# Patient Record
Sex: Male | Born: 1985 | Race: White | Hispanic: No | Marital: Single | Smoking: Current some day smoker
Health system: Southern US, Community
[De-identification: ages and names within clinical notes are randomized; demographics above are authoritative.]

## PROBLEM LIST (undated history)

## (undated) DIAGNOSIS — F319 Bipolar disorder, unspecified: Secondary | ICD-10-CM

## (undated) DIAGNOSIS — Z789 Other specified health status: Secondary | ICD-10-CM

## (undated) DIAGNOSIS — F209 Schizophrenia, unspecified: Secondary | ICD-10-CM

## (undated) HISTORY — PX: NO PAST SURGERIES: SHX2092

---

## 2021-11-17 ENCOUNTER — Other Ambulatory Visit: Payer: Self-pay

## 2021-11-17 ENCOUNTER — Encounter (HOSPITAL_COMMUNITY): Payer: Self-pay | Admitting: Emergency Medicine

## 2021-11-17 ENCOUNTER — Emergency Department (HOSPITAL_COMMUNITY)
Admission: EM | Admit: 2021-11-17 | Discharge: 2021-11-18 | Disposition: A | Discharge status: Home / Self Care | Payer: Medicare HMO | Attending: Emergency Medicine | Admitting: Emergency Medicine

## 2021-11-17 DIAGNOSIS — Z20822 Contact with and (suspected) exposure to covid-19: Secondary | ICD-10-CM | POA: Diagnosis not present

## 2021-11-17 DIAGNOSIS — F319 Bipolar disorder, unspecified: Secondary | ICD-10-CM

## 2021-11-17 DIAGNOSIS — F32A Depression, unspecified: Secondary | ICD-10-CM | POA: Diagnosis present

## 2021-11-17 DIAGNOSIS — F209 Schizophrenia, unspecified: Secondary | ICD-10-CM | POA: Insufficient documentation

## 2021-11-17 DIAGNOSIS — R45851 Suicidal ideations: Secondary | ICD-10-CM | POA: Diagnosis not present

## 2021-11-17 HISTORY — DX: Bipolar disorder, unspecified: F31.9

## 2021-11-17 HISTORY — DX: Schizophrenia, unspecified: F20.9

## 2021-11-17 LAB — COMPREHENSIVE METABOLIC PANEL
ALT: 19 U/L (ref 0–44)
AST: 26 U/L (ref 15–41)
Albumin: 3.8 g/dL (ref 3.5–5.0)
Alkaline Phosphatase: 73 U/L (ref 38–126)
Anion gap: 10 (ref 5–15)
BUN: 14 mg/dL (ref 6–20)
CO2: 24 mmol/L (ref 22–32)
Calcium: 9.3 mg/dL (ref 8.9–10.3)
Chloride: 106 mmol/L (ref 98–111)
Creatinine, Ser: 0.97 mg/dL (ref 0.61–1.24)
GFR, Estimated: >60 mL/min (ref >60)
Glucose, Bld: 134 mg/dL — ABNORMAL HIGH (ref 70–99)
Potassium: 3.4 mmol/L — ABNORMAL LOW (ref 3.5–5.1)
Sodium: 140 mmol/L (ref 135–145)
Total Bilirubin: 0.7 mg/dL (ref 0.3–1.2)
Total Protein: 6.4 g/dL — ABNORMAL LOW (ref 6.5–8.1)

## 2021-11-17 LAB — CBC
HCT: 39.3 % (ref 39.0–52.0)
Hemoglobin: 13.2 g/dL (ref 13.0–17.0)
MCH: 31.1 pg (ref 26.0–34.0)
MCHC: 33.6 g/dL (ref 30.0–36.0)
MCV: 92.5 fL (ref 80.0–100.0)
Platelets: 164 10*3/uL (ref 150–400)
RBC: 4.25 MIL/uL (ref 4.22–5.81)
RDW: 12.4 % (ref 11.5–15.5)
WBC: 5.1 10*3/uL (ref 4.0–10.5)
nRBC: 0 % (ref 0.0–0.2)

## 2021-11-17 LAB — RAPID URINE DRUG SCREEN, HOSP PERFORMED
Amphetamines: NOT DETECTED
Barbiturates: NOT DETECTED
Benzodiazepines: NOT DETECTED
Cocaine: NOT DETECTED
Opiates: NOT DETECTED
Tetrahydrocannabinol: NOT DETECTED

## 2021-11-17 LAB — ETHANOL: Alcohol, Ethyl (B): <10 mg/dL (ref <10)

## 2021-11-17 LAB — RESP PANEL BY RT-PCR (FLU A&B, COVID) ARPGX2
Influenza A by PCR: NEGATIVE
Influenza B by PCR: NEGATIVE
SARS Coronavirus 2 by RT PCR: NEGATIVE

## 2021-11-17 LAB — SALICYLATE LEVEL: Salicylate Lvl: <7 mg/dL — ABNORMAL LOW (ref 7.0–30.0)

## 2021-11-17 LAB — ACETAMINOPHEN LEVEL: Acetaminophen (Tylenol), Serum: <10 ug/mL — ABNORMAL LOW (ref 10–30)

## 2021-11-17 MED ORDER — ACETAMINOPHEN 325 MG PO TABS: 650.0000 mg | ORAL_TABLET | Freq: Four times a day (QID) | ORAL | Status: DC | PRN

## 2021-11-17 MED ORDER — LORAZEPAM 1 MG PO TABS
1.0000 mg | ORAL_TABLET | Freq: Once | ORAL | Status: AC
  Administered 2021-11-18: 1 mg via ORAL
  Filled 2021-11-17: qty 1

## 2021-11-17 MED ORDER — IBUPROFEN 400 MG PO TABS
400.0000 mg | ORAL_TABLET | Freq: Four times a day (QID) | ORAL | Status: DC | PRN
  Administered 2021-11-18: 400 mg via ORAL
  Filled 2021-11-17: qty 1

## 2021-11-17 NOTE — ED Notes (Signed)
Pt belongings inventoried and placed in locker 1 

## 2021-11-17 NOTE — ED Provider Notes (Signed)
Patient initially presented with suicidal ideation, did not elaborate much with triage nurse/tech. While waiting in the ED he is requesting to leave, encouraged to remain int he ED, he escalated some despite attempts at redirection, ultimately IVC paperwork completed.    Maurice [name] 11/17/21 3709    Glynn Octave, MD 11/17/21 [phone]

## 2021-11-17 NOTE — ED Provider Notes (Signed)
[facility] EMERGENCY DEPARTMENT Provider Note   CSN: [id number] Arrival date & time: 11/17/21  0416     History Chief Complaint  Patient presents with   Suicidal    IVC by PA    Maurice [name] is a 35 y.o. male.  HPI  Patient presents to the ED with complaints of depression and suicidal ideation.  Patient states that has been ongoing for a while.  Maybe a year.  He has been thinking about taking pills.  He denies any other complaints at this time.  No chest pain.  No abdominal pain.  No fevers or chills.  Patient initially was IVC by the initial screening provider as the patient was attempting to leave.  History reviewed. No pertinent past medical history.  There are no problems to display for this patient.   History reviewed. No pertinent surgical history.     No family history on file.     Home Medications Prior to Admission medications   Medication Sig Start Date End Date Taking? Authorizing Provider  ibuprofen (ADVIL) 200 MG tablet Take 400 mg by mouth every 6 (six) hours as needed for headache or moderate pain.   Yes [provider]    Allergies    Patient has no known allergies.  Review of Systems   Review of Systems  All other systems reviewed and are negative.  Physical Exam Updated Vital Signs BP 100/68 (BP Location: Right Arm)   Pulse 64   Temp 97.9 F (36.6 C) (Oral)   Resp 16   SpO2 97%   Physical Exam Vitals and nursing note reviewed.  Constitutional:      General: He is not in acute distress.    Appearance: He is well-developed.  HENT:     Head: Normocephalic and atraumatic.     Right Ear: External ear normal.     Left Ear: External ear normal.  Eyes:     General: No scleral icterus.       Right eye: No discharge.        Left eye: No discharge.     Conjunctiva/sclera: Conjunctivae normal.  Neck:     Trachea: No tracheal deviation.  Cardiovascular:     Rate and Rhythm: Normal rate and regular rhythm.   Pulmonary:     Effort: Pulmonary effort is normal. No respiratory distress.     Breath sounds: Normal breath sounds. No stridor. No wheezing or rales.  Abdominal:     General: Bowel sounds are normal. There is no distension.     Palpations: Abdomen is soft.     Tenderness: There is no abdominal tenderness. There is no guarding or rebound.  Musculoskeletal:        General: No tenderness or deformity.     Cervical back: Neck supple.  Skin:    General: Skin is warm and dry.     Findings: No rash.  Neurological:     General: No focal deficit present.     Mental Status: He is alert.     Cranial Nerves: No cranial nerve deficit (no facial droop, extraocular movements intact, no slurred speech).     Sensory: No sensory deficit.     Motor: No abnormal muscle tone or seizure activity.     Coordination: Coordination normal.  Psychiatric:        Mood and Affect: Mood is depressed.        Speech: Speech is not delayed or tangential.  Behavior: Behavior is not aggressive or hyperactive.        Thought Content: Thought content includes suicidal ideation.    ED Results / Procedures / Treatments   Labs (all labs ordered are listed, but only abnormal results are displayed) Labs Reviewed  COMPREHENSIVE METABOLIC PANEL - Abnormal; Notable for the following components:      Result Value   Potassium 3.4 (*)    Glucose, Bld 134 (*)    Total Protein 6.4 (*)    All other components within normal limits  SALICYLATE LEVEL - Abnormal; Notable for the following components:   Salicylate Lvl <7.0 (*)    All other components within normal limits  ACETAMINOPHEN LEVEL - Abnormal; Notable for the following components:   Acetaminophen (Tylenol), Serum <10 (*)    All other components within normal limits  RESP PANEL BY RT-PCR (FLU A&B, COVID) ARPGX2  ETHANOL  CBC  RAPID URINE DRUG SCREEN, HOSP PERFORMED    EKG None  Radiology No results found.  Procedures Procedures   Medications  Ordered in ED Medications - No data to display  ED Course  I have reviewed the triage vital signs and the nursing notes.  Pertinent labs & imaging results that were available during my care of the patient were reviewed by me and considered in my medical decision making (see chart for details).  Clinical Course as of 11/17/21 1715  Mon Nov 17, 2021  1714 Labs reviewed.  CBC and metabolic panel unremarkable.  UDS negative. [JK]    Clinical Course User Index [JK] Linwood Dibbles, MD   MDM Rules/Calculators/A&P                          The patient has been placed in psychiatric observation due to the need to provide a safe environment for the patient while obtaining psychiatric consultation and evaluation, as well as ongoing medical and medication management to treat the patient's condition.  The patient has been placed under full IVC at this time.  Pt medically cleared for psych evaluation  Final Clinical Impression(s) / ED Diagnoses Final diagnoses:  Suicidal ideation    Rx / DC Orders ED Discharge Orders     None        Linwood Dibbles, MD 11/17/21 1715
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
## 2021-11-17 NOTE — BH Assessment (Signed)
Clinician made contact with pt's team at 2157 in preparation to complete pt's MH Assessment. Clinician received no response that pt was able to be moved (as pt was in the hallway at the time), so clinician informed pt's team at 2239 that she needed to move on to the next waiting pt. Clinician requested pt's team inform her when pt is ready to be seen.

## 2021-11-17 NOTE — BH Assessment (Addendum)
Clinician attempted to see patient but the machine is being used at this time.

## 2021-11-18 ENCOUNTER — Encounter (HOSPITAL_COMMUNITY): Payer: Self-pay

## 2021-11-18 ENCOUNTER — Inpatient Hospital Stay (HOSPITAL_COMMUNITY)
Admission: AD | Admit: 2021-11-18 | Discharge: 2021-12-02 | DRG: 885 | Disposition: A | Discharge status: Home / Self Care | Payer: Medicare HMO | Source: Intra-hospital | Attending: Emergency Medicine | Admitting: Emergency Medicine

## 2021-11-18 ENCOUNTER — Other Ambulatory Visit: Payer: Self-pay

## 2021-11-18 ENCOUNTER — Encounter (HOSPITAL_COMMUNITY): Payer: Self-pay | Admitting: Student

## 2021-11-18 DIAGNOSIS — F4010 Social phobia, unspecified: Secondary | ICD-10-CM | POA: Diagnosis present

## 2021-11-18 DIAGNOSIS — M25512 Pain in left shoulder: Secondary | ICD-10-CM | POA: Diagnosis present

## 2021-11-18 DIAGNOSIS — F22 Delusional disorders: Secondary | ICD-10-CM | POA: Diagnosis present

## 2021-11-18 DIAGNOSIS — F1410 Cocaine abuse, uncomplicated: Secondary | ICD-10-CM | POA: Diagnosis present

## 2021-11-18 DIAGNOSIS — Z23 Encounter for immunization: Secondary | ICD-10-CM | POA: Diagnosis present

## 2021-11-18 DIAGNOSIS — M25519 Pain in unspecified shoulder: Secondary | ICD-10-CM

## 2021-11-18 DIAGNOSIS — R112 Nausea with vomiting, unspecified: Secondary | ICD-10-CM | POA: Diagnosis present

## 2021-11-18 DIAGNOSIS — Z20822 Contact with and (suspected) exposure to covid-19: Secondary | ICD-10-CM | POA: Diagnosis present

## 2021-11-18 DIAGNOSIS — F419 Anxiety disorder, unspecified: Secondary | ICD-10-CM | POA: Diagnosis present

## 2021-11-18 DIAGNOSIS — F319 Bipolar disorder, unspecified: Principal | ICD-10-CM | POA: Diagnosis present

## 2021-11-18 DIAGNOSIS — R45851 Suicidal ideations: Secondary | ICD-10-CM | POA: Diagnosis present

## 2021-11-18 DIAGNOSIS — F1110 Opioid abuse, uncomplicated: Secondary | ICD-10-CM | POA: Diagnosis present

## 2021-11-18 DIAGNOSIS — K7682 Hepatic encephalopathy: Secondary | ICD-10-CM | POA: Diagnosis not present

## 2021-11-18 DIAGNOSIS — F1020 Alcohol dependence, uncomplicated: Secondary | ICD-10-CM | POA: Diagnosis present

## 2021-11-18 DIAGNOSIS — G4700 Insomnia, unspecified: Secondary | ICD-10-CM | POA: Diagnosis present

## 2021-11-18 DIAGNOSIS — E7220 Disorder of urea cycle metabolism, unspecified: Secondary | ICD-10-CM

## 2021-11-18 DIAGNOSIS — F17210 Nicotine dependence, cigarettes, uncomplicated: Secondary | ICD-10-CM | POA: Diagnosis present

## 2021-11-18 DIAGNOSIS — R45850 Homicidal ideations: Secondary | ICD-10-CM | POA: Diagnosis present

## 2021-11-18 DIAGNOSIS — R451 Restlessness and agitation: Secondary | ICD-10-CM | POA: Diagnosis present

## 2021-11-18 DIAGNOSIS — R7401 Elevation of levels of liver transaminase levels: Secondary | ICD-10-CM | POA: Diagnosis present

## 2021-11-18 DIAGNOSIS — K759 Inflammatory liver disease, unspecified: Secondary | ICD-10-CM | POA: Diagnosis present

## 2021-11-18 DIAGNOSIS — K5900 Constipation, unspecified: Secondary | ICD-10-CM | POA: Diagnosis present

## 2021-11-18 DIAGNOSIS — F250 Schizoaffective disorder, bipolar type: Principal | ICD-10-CM

## 2021-11-18 HISTORY — DX: Other specified health status: Z78.9

## 2021-11-18 MED ORDER — LORAZEPAM 1 MG PO TABS: 1.0000 mg | ORAL_TABLET | Freq: Four times a day (QID) | ORAL | Status: AC | PRN

## 2021-11-18 MED ORDER — MAGNESIUM HYDROXIDE 400 MG/5ML PO SUSP: 30.0000 mL | Freq: Every day | ORAL | Status: DC | PRN

## 2021-11-18 MED ORDER — ACETAMINOPHEN 325 MG PO TABS
650.0000 mg | ORAL_TABLET | Freq: Four times a day (QID) | ORAL | Status: DC | PRN
  Administered 2021-11-19 – 2021-11-22 (×7): 650 mg via ORAL
  Filled 2021-11-18 (×8): qty 2

## 2021-11-18 MED ORDER — HYDROXYZINE HCL 25 MG PO TABS
25.0000 mg | ORAL_TABLET | Freq: Four times a day (QID) | ORAL | Status: AC | PRN
  Administered 2021-11-19 – 2021-11-21 (×4): 25 mg via ORAL
  Filled 2021-11-18 (×2): qty 1

## 2021-11-18 MED ORDER — LOPERAMIDE HCL 2 MG PO CAPS: 2.0000 mg | ORAL_CAPSULE | ORAL | Status: AC | PRN

## 2021-11-18 MED ORDER — INFLUENZA VAC SPLIT QUAD 0.5 ML IM SUSY
0.5000 mL | PREFILLED_SYRINGE | INTRAMUSCULAR | Status: DC
  Filled 2021-11-18: qty 0.5

## 2021-11-18 MED ORDER — TRAZODONE HCL 50 MG PO TABS
50.0000 mg | ORAL_TABLET | Freq: Every evening | ORAL | Status: DC | PRN
  Administered 2021-11-19 – 2021-11-24 (×5): 50 mg via ORAL
  Filled 2021-11-18 (×5): qty 1

## 2021-11-18 MED ORDER — ALUM & MAG HYDROXIDE-SIMETH 200-200-20 MG/5ML PO SUSP: 30.0000 mL | ORAL | Status: DC | PRN

## 2021-11-18 MED ORDER — ONDANSETRON 4 MG PO TBDP: 4.0000 mg | ORAL_TABLET | Freq: Four times a day (QID) | ORAL | Status: AC | PRN

## 2021-11-18 MED ORDER — GABAPENTIN 100 MG PO CAPS
200.0000 mg | ORAL_CAPSULE | Freq: Three times a day (TID) | ORAL | Status: DC
  Administered 2021-11-19 (×2): 200 mg via ORAL
  Filled 2021-11-18 (×8): qty 2

## 2021-11-18 MED ORDER — ADULT MULTIVITAMIN W/MINERALS CH
1.0000 | ORAL_TABLET | Freq: Every day | ORAL | Status: DC
  Administered 2021-11-19 – 2021-12-02 (×14): 1 via ORAL
  Filled 2021-11-18 (×18): qty 1

## 2021-11-18 MED ORDER — THIAMINE HCL 100 MG PO TABS
100.0000 mg | ORAL_TABLET | Freq: Every day | ORAL | Status: DC
  Administered 2021-11-19 – 2021-12-02 (×14): 100 mg via ORAL
  Filled 2021-11-18 (×18): qty 1

## 2021-11-18 MED ORDER — OLANZAPINE 5 MG PO TABS
5.0000 mg | ORAL_TABLET | Freq: Every day | ORAL | Status: DC
  Filled 2021-11-18: qty 1

## 2021-11-18 MED ORDER — HYDROXYZINE HCL 25 MG PO TABS: 25.0000 mg | ORAL_TABLET | Freq: Three times a day (TID) | ORAL | Status: DC | PRN

## 2021-11-18 MED ORDER — OLANZAPINE 5 MG PO TABS
5.0000 mg | ORAL_TABLET | Freq: Every day | ORAL | Status: DC
  Administered 2021-11-18: 5 mg via ORAL
  Filled 2021-11-18: qty 1

## 2021-11-18 MED ORDER — GABAPENTIN 100 MG PO CAPS
200.0000 mg | ORAL_CAPSULE | Freq: Three times a day (TID) | ORAL | Status: DC
  Administered 2021-11-18 (×2): 200 mg via ORAL
  Filled 2021-11-18 (×2): qty 2
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
## 2021-11-18 NOTE — ED Notes (Signed)
Officers returned for escort to [employer].

## 2021-11-18 NOTE — ED Notes (Signed)
[name] arrived to escort patient to [name]. Missing pages in IVC required for [facility] to accept transport. Officer to return at 2030 once paperwork is in order.

## 2021-11-18 NOTE — Tx Team (Signed)
Initial Treatment Plan 11/18/2021 10:58 PM [name] [id number]    PATIENT STRESSORS: Financial difficulties   Legal issue   Substance abuse   Other: homeless     PATIENT STRENGTHS: Physical Health  Supportive family/friends    PATIENT IDENTIFIED PROBLEMS: Psychosis  Substance abuse  Suicidal ideation  Depression    "Get me back to [facility]"           DISCHARGE CRITERIA:  Improved stabilization in mood, thinking, and/or behavior Need for constant or close observation no longer present Reduction of life-threatening or endangering symptoms to within safe limits Verbal commitment to aftercare and medication compliance  PRELIMINARY DISCHARGE PLAN: Outpatient therapy  PATIENT/FAMILY INVOLVEMENT: This treatment plan has been presented to and reviewed with the patient, Maurice [name].  The patient and family have been given the opportunity to ask questions and make suggestions.  Levin Bacon, RN 11/18/2021, 10:58 PM
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
## 2021-11-18 NOTE — Progress Notes (Signed)
Pt accepted to [facility] 400-1    Patient meets inpatient criteria per Ophelia Shoulder, NP  The attending provider will be [place], MD  Call report to [phone]    Mikle Bosworth, RN @ [facility] ED notified.     Pt scheduled  to arrive at [facility] TODAY at 1500. Patient's voluntary or IVC paperwork must be sent to [facility] prior to transporting the Pt.    Damita Dunnings, MSW, LCSW-A  11:21 AM 11/18/2021

## 2021-11-18 NOTE — ED Notes (Signed)
Called for [employer] transport to [facility]

## 2021-11-18 NOTE — ED Notes (Signed)
Pt's linen fully changed while he was in shower. Sitter is currently waiting for him to finish.

## 2021-11-18 NOTE — ED Notes (Signed)
Pt was given personal hygiene products and clean scrubs and sitter took pt to get a shower. 
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
## 2021-11-18 NOTE — BH Assessment (Signed)
Comprehensive Clinical Assessment (CCA) Note  11/18/2021 Gerald Stabs [id number]    Disposition:  Margorie John, PA recommended IP.  AC reviewing beds, if no beds available Disposition SW will seek placement in the morning.   The patient demonstrates the following risk factors for suicide: Chronic risk factors for suicide include: psychiatric disorder of Schizophrenia and previous suicide attempts via overdose . Acute risk factors for suicide include: family or marital conflict. Protective factors for this patient include:  Pt refused to disclose . Considering these factors, the overall suicide risk at this point appears to be high. Patient is not appropriate for outpatient follow up.   [facility] ED from 11/17/2021 in [place] [facility]        Chief Complaint:  Chief Complaint  Patient presents with   Suicidal    IVC by PA    Patient is a 35 year old male presenting to the ED due to suicidal thoughts with a plan to overdose on pills. Pt was IVC in the ED due to attempting to leave. Patient reports last SI was today and reports arguing with his mom as the trigger.  Pt reports Hi, but refused to disclose any information. Patient reports a hx of SI and reports a hx of suicide attempts via overdosing on pills and attempting to drink a lot of water to flush out all of his electrolytes.  Pt denies access to weapons. Pt reports having felonies in multiple states, but would not disclose any details, and reports having a restraining order in the state of [place]. Pt reports a hx of Schizophrenia, non med compliant and denies any current OP Mental Health Treatment.  Pt reports AVH of "voices I know, I can't describe what I see".  Dispositioned with Margorie John, PA recommended IP. AC reviewing beds, if no beds available Disposition SW will seek placement in the morning.      Visit Diagnosis: Schizophrenia    CCA Screening, Triage  and Referral (STR)  Patient Reported Information How did you hear about [place]? Self  What Is the Reason for Your Visit/Call Today? Patient reports to the ED with suidal thoughts with a plan to OD on pills. Pt was voluntary and attempted to leave the ED, and was placed on IVC.  How Long Has This Been Causing You Problems? <Week  What Do You Feel Would Help You the Most Today? Treatment for Depression or other mood problem   Have You Recently Had Any Thoughts About Hurting Yourself? Yes (patient reports last SI was today.)  Are You Planning to Commit Suicide/Harm Yourself At This time? No   Have you Recently Had Thoughts About [place]? Yes (pt reports Hi but would not disclose any information)  Are You Planning to Harm Someone at This Time? No  Explanation: No data recorded  Have You Used Any Alcohol or Drugs in the Past 24 Hours? No  How Long Ago Did You Use Drugs or Alcohol? No data recorded What Did You Use and How Much? No data recorded  Do You Currently Have a Therapist/Psychiatrist? No  Name of Therapist/Psychiatrist: No data recorded  Have You Been Recently Discharged From Any Office Practice or Programs? No  Explanation of Discharge From Practice/Program: No data recorded    CCA Screening Triage Referral Assessment Type of Contact: Tele-Assessment  Telemedicine Service Delivery:   Is this Initial or Reassessment? Initial Assessment  Date Telepsych consult ordered in [facility]:  11/17/21  Time Telepsych consult ordered in [facility]:  2325  Location of Assessment: [facility] ED  Provider Location: [facility]   Collateral Involvement: n/A   Does Patient Have a Automotive engineer Guardian? No data recorded Name and Contact of Legal Guardian: No data recorded If Minor and Not Living with Parent(s), Who has Custody? n/a  Is CPS involved or ever been involved? Never  Is APS involved or ever been involved? Never   Patient Determined To Be At Risk  for Harm To Self or Others Based on Review of Patient Reported Information or Presenting Complaint? Yes, for Self-Harm  Method: No Plan  Availability of Means: No access or NA  Intent: Vague intent or NA  Notification Required: No need or identified person  Additional Information for Danger to Others Potential: -- (unknown, patient refused)  Additional Comments for Danger to Others Potential: n/a  Are There Guns or Other Weapons in Your Home? No  Types of Guns/Weapons: No data recorded Are These Weapons Safely Secured?                            No data recorded Who Could Verify You Are Able To Have These Secured: No data recorded Do You Have any Outstanding Charges, Pending Court Dates, Parole/Probation? Patient reports having  felony in mult states and reports a restraining order in [place].  Contacted To Inform of Risk of Harm To Self or Others: -- (n/a)    Does Patient Present under Involuntary Commitment? Yes  IVC Papers Initial File Date:  (unknown)   [place] of Residence: [place]   Patient Currently Receiving the Following Services: -- (None)   Determination of Need: Emergent (2 hours)   Options For Referral: Inpatient Hospitalization     CCA Biopsychosocial Patient Reported Schizophrenia/Schizoaffective Diagnosis in Past: No data recorded  Strengths: unable to asses   Mental Health Symptoms Depression:   -- (unable to asses)   Duration of Depressive symptoms:    Mania:   -- (unable to asses)   Anxiety:    -- (unable to asses)   Psychosis:   -- (unable to asses)   Duration of Psychotic symptoms:    Trauma:   -- (unable to asses)   Obsessions:   -- (unable to asses)   Compulsions:   -- (unable to asses)   Inattention:   -- (unable to asses)   Hyperactivity/Impulsivity:   -- (unable to asses)   Oppositional/Defiant Behaviors:   -- (unable to asses)   Emotional Irregularity:   -- (unable to asses)   Other Mood/Personality  Symptoms:   unable to asses    Mental Status Exam Appearance and self-care  Stature:   Small   Weight:   Average weight   Clothing:   -- (patient is in scrubs)   Grooming:   Normal   Cosmetic use:   None   Posture/gait:   Slumped   Motor activity:   Restless   Sensorium  Attention:   Confused   Concentration:   Focuses on irrelevancies   Orientation:   X5   Recall/memory:   Defective in Immediate   Affect and Mood  Affect:   Flat   Mood:   Depressed   Relating  Eye contact:   Avoided   Facial expression:   Constricted   Attitude toward examiner:   Cooperative; Uninterested   Thought and Language  Speech flow:  Soft   Thought content:  Appropriate to Mood and Circumstances   Preoccupation:   None   Hallucinations:   Auditory; Visual (pt reports AVH)   Organization:  No data recorded  [employer] of Knowledge:   Poor   Intelligence:   Average   Abstraction:   -- (unable to asses)   Judgement:   Poor   Reality Testing:   Distorted   Insight:   Poor   Decision Making:   Confused   Social Functioning  Social Maturity:   -- (unable to asses)   Social Judgement:   -- (unable to asses)   Stress  Stressors:   -- (unable to asses)   Coping Ability:   -- (unable to asses)   Skill Deficits:   -- (unable to asses)   Supports:   -- (unable to asses)     Religion: Religion/Spirituality Are You A Religious Person?: No How Might This Affect Treatment?: unable to asses  Leisure/Recreation: Leisure / Recreation Do You Have Hobbies?: No  Exercise/Diet: Exercise/Diet Do You Exercise?: No Have You Gained or Lost A Significant Amount of Weight in the Past Six Months?: No Do You Follow a Special Diet?: No Do You Have Any Trouble Sleeping?: No   CCA Employment/Education Employment/Work Situation: Employment / Work Technical sales engineer:  (unable to asses) Patient's Job has Been  Impacted by Current Illness:  (unable to asses) Has Patient ever Been in the [employer]?:  (unable to asses)  Education: Education Is Patient Currently Attending School?: No Did Physicist, medical?: No Did You Have An Individualized Education Program (IIEP): No Did You Have Any Difficulty At School?: No Patient's Education Has Been Impacted by Current Illness: No   CCA Family/Childhood History Family and Relationship History: Family history Does patient have children?: Yes How many children?: 2 How is patient's relationship with their children?: unable to asses  Childhood History:  Childhood History By whom was/is the patient raised?:  (unable to asses) Did patient suffer any verbal/emotional/physical/sexual abuse as a child?: No Did patient suffer from severe childhood neglect?: No Has patient ever been sexually abused/assaulted/raped as an adolescent or adult?: No Was the patient ever a victim of a crime or a disaster?: No Witnessed domestic violence?: No Has patient been affected by domestic violence as an adult?: No  Child/Adolescent Assessment:     CCA Substance Use Alcohol/Drug Use: Alcohol / Drug Use Pain Medications: unable to asses Prescriptions: unable to asses Over the Counter: unable to asses History of alcohol / drug use?: Yes Longest period of sobriety (when/how long): unable to asses Negative Consequences of Use:  (unable to asses) Withdrawal Symptoms:  (unable to asses) Substance #1 Name of Substance 1: Crack 1 - Age of First Use: unable to asses 1 - Amount (size/oz): unable to asses 1 - Frequency: unable to asses 1 - Duration: unable to asses 1 - Last Use / Amount: 2-3 days ago 1 - Method of Aquiring: unable to asses 1- Route of Use: unable to asses                       ASAM's:  Six Dimensions of Multidimensional Assessment  Dimension 1:  Acute Intoxication and/or Withdrawal Potential:      Dimension 2:  Biomedical Conditions and  Complications:      Dimension 3:  Emotional, Behavioral, or Cognitive Conditions and Complications:     Dimension 4:  Readiness to Change:     Dimension 5:  Relapse, Continued use, or  Continued Problem Potential:     Dimension 6:  Recovery/Living Environment:     ASAM Severity Score:    ASAM Recommended Level of Treatment: ASAM Recommended Level of Treatment: Level I Outpatient Treatment   Substance use Disorder (SUD) Substance Use Disorder (SUD)  Checklist Symptoms of Substance Use:  (unable to asses)  Recommendations for Services/Supports/Treatments: Recommendations for Services/Supports/Treatments Recommendations For Services/Supports/Treatments: SAIOP (Substance Abuse Intensive Outpatient Program)  Discharge Disposition: Discharge Disposition Medical Exam completed: Yes Disposition of Patient: Admit Mode of transportation if patient is discharged/movement?: N/A  DSM5 Diagnoses: There are no problems to display for this patient.    Referrals to Alternative Service(s): Referred to Alternative Service(s):   Place:   Date:   Time:    Referred to Alternative Service(s):   Place:   Date:   Time:    Referred to Alternative Service(s):   Place:   Date:   Time:    Referred to Alternative Service(s):   Place:   Date:   Time:     Latausha Flamm M. Jlynn Ly, Deon Pilling

## 2021-11-18 NOTE — ED Notes (Signed)
During transport back to hallway 20; the individual transporting patient informed me that during so, patient's foot contacted another stretcher that was stationary. No hematomas, bleeding, abrasions,or injuries noted otherwise. Patient denies pain. Dr. Lynelle Doctor was informed of the occurrence and evaluated the patient. No follow-ups needed.

## 2021-11-18 NOTE — Progress Notes (Addendum)
Maurice [name] is a 35 year old male being admitted involuntarily to 400-1 from [facility].  He presented to the ED with suicidal thoughts with a plan to overdose on pills. Pt was IVC while in the ED due to attempting to leave. Patient reported that he had an argument with his mom as the trigger.  Pt reported HI, but refused to disclose any information. He has a history of a suicide attempts via overdosing on pills and attempting to drink a lot of water to flush out all of his electrolytes. He reported a hx of Schizophrenia and has not been taking medications.  Pt reports AVH of "voices I know, I can't describe what I see".  During [facility] admission, he was cooperative with assessment at first but became mute after last question was asked.  He did deny SI/HI or AVH.  He voiced having multiple falls in the past 6 months, "I fell 9 times in one week and they thought I had seizures." While trying to complete the skin assessment he had difficulty walking and following directions.  This Clinical research associate and MHT had to take his clothing off for skin assessment and redress him after. Oriented him to the unit.  He was excorted to his room, he was noted sitting on the side of the bed staring at the floor.  After staff left the room, he did get up and walk around the room without difficulty.  He requested something to eat and promptly returned to his room.  Admission paperwork completed and signed.  Belongings searched and secured in locker # 35.  Skin assessment completed and noted multiple tattoos and abrasion to the top of the head.  No contraband found.  Suicide safety plan reviewed, given to patient to complete and return to his nurse.  Q 15 minute checks initiated for safety.  We will monitor the progress towards his goals.

## 2021-11-19 ENCOUNTER — Encounter (HOSPITAL_COMMUNITY): Payer: Self-pay

## 2021-11-19 DIAGNOSIS — F319 Bipolar disorder, unspecified: Secondary | ICD-10-CM

## 2021-11-19 LAB — LIPID PANEL
Cholesterol: 138 mg/dL (ref 0–200)
HDL: 43 mg/dL (ref >40)
LDL Cholesterol: 45 mg/dL (ref 0–99)
Total CHOL/HDL Ratio: 3.2 RATIO
Triglycerides: 249 mg/dL — ABNORMAL HIGH (ref <150)
VLDL: 50 mg/dL — ABNORMAL HIGH (ref 0–40)

## 2021-11-19 LAB — HEMOGLOBIN A1C
Hgb A1c MFr Bld: 4.8 % (ref 4.8–5.6)
Mean Plasma Glucose: 91.06 mg/dL

## 2021-11-19 LAB — TSH: TSH: 2.151 u[IU]/mL (ref 0.350–4.500)

## 2021-11-19 MED ORDER — ZIPRASIDONE MESYLATE 20 MG IM SOLR: 20.0000 mg | INTRAMUSCULAR | Status: DC | PRN

## 2021-11-19 MED ORDER — HALOPERIDOL LACTATE 5 MG/ML IJ SOLN: 5.0000 mg | Freq: Four times a day (QID) | INTRAMUSCULAR | Status: DC | PRN

## 2021-11-19 MED ORDER — GABAPENTIN 100 MG PO CAPS
200.0000 mg | ORAL_CAPSULE | Freq: Three times a day (TID) | ORAL | Status: DC
  Administered 2021-11-20 – 2021-11-21 (×5): 200 mg via ORAL
  Filled 2021-11-19 (×10): qty 2

## 2021-11-19 MED ORDER — OLANZAPINE 10 MG PO TABS
10.0000 mg | ORAL_TABLET | Freq: Two times a day (BID) | ORAL | Status: DC
  Administered 2021-11-19 – 2021-11-21 (×5): 10 mg via ORAL
  Filled 2021-11-19 (×7): qty 1

## 2021-11-19 MED ORDER — LORAZEPAM 2 MG/ML IJ SOLN: 2.0000 mg | Freq: Four times a day (QID) | INTRAMUSCULAR | Status: DC | PRN

## 2021-11-19 MED ORDER — LORAZEPAM 1 MG PO TABS
1.0000 mg | ORAL_TABLET | ORAL | Status: AC | PRN
  Administered 2021-11-20: 1 mg via ORAL
  Filled 2021-11-19: qty 1

## 2021-11-19 MED ORDER — OLANZAPINE 10 MG PO TBDP
10.0000 mg | ORAL_TABLET | Freq: Three times a day (TID) | ORAL | Status: DC | PRN
  Administered 2021-11-20: 10 mg via ORAL
  Filled 2021-11-19: qty 1

## 2021-11-19 MED ORDER — DIPHENHYDRAMINE HCL 50 MG/ML IJ SOLN: 50.0000 mg | Freq: Four times a day (QID) | INTRAMUSCULAR | Status: DC | PRN

## 2021-11-19 MED ORDER — DIVALPROEX SODIUM 500 MG PO DR TAB
500.0000 mg | DELAYED_RELEASE_TABLET | Freq: Two times a day (BID) | ORAL | Status: DC
  Administered 2021-11-19 – 2021-11-24 (×10): 500 mg via ORAL
  Filled 2021-11-19 (×16): qty 1

## 2021-11-19 NOTE — Progress Notes (Signed)
Multiple attempts to wake Maurice [name] up for labs.  He would not get out of bed.  He did finally get up and went to the cafeteria for breakfast.

## 2021-11-19 NOTE — Progress Notes (Signed)
1:1 note @ 0800 Pt moved to 500 unit, put on unit restrictions, and 1:1 due to being in another pt.'s room. Male MHT  present in day room with patient. Safety maintained continue 1:1  @1238   Pt. Up at med window. Male MHT present, safety maintained, continue 1:1.  @1650  Pt. Up at med window. Male MHT present, safety maintained, conttinue 1:1

## 2021-11-19 NOTE — Progress Notes (Signed)
Close observation note D:Pt observed sleeping in bed with eyes closed. RR even and unlabored. No distress noted. A: Close observation continues for safety  R: pt remains safe 

## 2021-11-19 NOTE — BHH Counselor (Addendum)
CSW spoke with the Receptionist who states that she received a call from the [facility] office stating that the Pt's ex-girlfriend has pressed charges against him for calling her from the hospital.  CSW has not received any other information regarding this issue at this time.  CSW informed the provider of this matter and the provider has placed an order for phone restrictions in the Pt's chart.  CSW will follow up with the [facility] Office regarding these charges to aid in discharge planning.

## 2021-11-19 NOTE — BHH Suicide Risk Assessment (Addendum)
[facility] Admission Suicide Risk Assessment   Nursing information obtained from:  Patient Demographic factors:  Unemployed, Caucasian Current Mental Status:  NA Loss Factors:  Financial problems / change in socioeconomic status Historical Factors:  Prior suicide attempts, Impulsivity Risk Reduction Factors:  NA  Total Time spent with patient: 1 hour Principal Problem: Bipolar disorder (HCC) Diagnosis:  Principal Problem:   Bipolar disorder (HCC)   Subjective Data:  Patient is a 35 year old male with reported psychiatric history of schizophrenia, bipolar disorder, heroin use disorder, cocaine use disorder, alcohol use disorder presents to behavioral health hospital from [facility], ED due to suicidal ideation.  Assessment today, patient reports to being admitted to hospital due to "suicidal ideation".  Patient begins to be extremely tangential and responding to internal stimuli throughout assessment.  Patient would turn his head towards an empty space and in a conversation with the air as if speaking with an in visible observer and then redirect to speaking with this Clinical research associate as if nothing had happened.  Patient reports that he had taken a freight train from [place] to [place] because of paranoia with regards to the city of [place].  Patient reports that he had drank multiple liters of alcohol in order to keep himself warm during the freight train ride.  Patient then reports that he is an official Buddhist [place] from [place] and that he has 11 children of which 2 are his.  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  Patient then asked this writer whether he would like to perform an alliance with the "street enforcers" in order for him to protect the Asian race here in [place].  Patient appears very defiant towards Caucasian males while on the unit.  Patient reports he has a history of schizophrenia and was reportedly originally from  [place].  Patient denies present suicidal ideation, homicidal ideation, auditory/visual hallucinations, but seems to be actively responding to internal stimuli.  Patient reports that he was formally on Abilify, Zyprexa, and lithium.  Patient reports also having been tried on multiple other psychotropic medications.      Continued Clinical Symptoms:  Alcohol Use Disorder Identification Test Final Score (AUDIT): 11 The "Alcohol Use Disorders Identification Test", Guidelines for Use in Primary Care, Second Edition.  World Science writer [facility]). Score between 0-7:  no or low risk or alcohol related problems. Score between 8-15:  moderate risk of alcohol related problems. Score between 16-19:  high risk of alcohol related problems. Score 20 or above:  warrants further diagnostic evaluation for alcohol dependence and treatment.   CLINICAL FACTORS:   Alcohol/Substance Abuse/Dependencies Schizophrenia:   Less than 52 years old Paranoid or undifferentiated type Personality Disorders:   Comorbid alcohol abuse/dependence More than one psychiatric diagnosis Previous Psychiatric Diagnoses and Treatments   Musculoskeletal: Strength & Muscle Tone: within normal limits Gait & Station: normal Patient leans: N/A  Psychiatric Specialty Exam:  Presentation  General Appearance: Bizarre; Casual   Eye Contact:Fair   Speech:Garbled; Blocked   Speech Volume:Normal   Handedness:Right   Mood and Affect  Mood:Dysphoric; Labile   Affect:Inappropriate; Labile    Thought Process  Thought Processes:Disorganized; Irrevelant   Descriptions of Associations:Tangential   Orientation:Partial   Thought Content:Illogical; Scattered   History of Schizophrenia/Schizoaffective disorder:No data recorded  Duration of Psychotic Symptoms:No data recorded  Hallucinations:Hallucinations: None (Appears to be responding to internal stimuli on assessment)   Ideas of  Reference:None   Suicidal Thoughts:Suicidal Thoughts: Yes, Passive  SI Passive Intent and/or Plan: With Plan; With Means to Carry Out; With Access to Means   Homicidal Thoughts:Homicidal Thoughts: Yes, Passive HI Passive Intent and/or Plan: Without Means to Carry Out; Without Access to Means    Sensorium  Memory:Immediate Fair; Remote Poor; Recent Poor   Judgment:Impaired   Insight:None    Executive Functions  Concentration:Fair   Attention Span:Fair   Recall:Fair   Fund of Knowledge:Poor   Language:Good    Psychomotor Activity  Psychomotor Activity:Psychomotor Activity: Normal    Assets  Assets:Resilience    Sleep  Sleep:Sleep: Fair Number of Hours of Sleep: 6     Physical Exam: Physical Exam Vitals and nursing note reviewed.  Constitutional:      Appearance: Normal appearance. He is normal weight.  HENT:     Head: Normocephalic and atraumatic.  Pulmonary:     Effort: Pulmonary effort is normal.  Neurological:     General: No focal deficit present.     Mental Status: He is oriented to person, place, and time.   Review of Systems  Respiratory:  Negative for shortness of breath.   Cardiovascular:  Negative for chest pain.  Gastrointestinal:  Negative for abdominal pain, constipation, diarrhea, heartburn, nausea and vomiting.  Neurological:  Negative for headaches.  Blood pressure (!) 94/52, pulse 86, temperature 98.2 F (36.8 C), temperature source Oral, resp. rate 18, height 5' 7.5" (1.715 m), weight 78.1 kg, SpO2 100 %. Body mass index is 26.57 kg/m.   COGNITIVE FEATURES THAT CONTRIBUTE TO RISK:  Closed-mindedness, Polarized thinking, and Thought constriction (tunnel vision)    SUICIDE RISK:   Severe:  Frequent, intense, and enduring suicidal ideation, specific plan, no subjective intent, but some objective markers of intent (i.e., choice of lethal method), the method is accessible, some limited preparatory behavior, evidence of  impaired self-control, severe dysphoria/symptomatology, multiple risk factors present, and few if any protective factors, particularly a lack of social support.  PLAN OF CARE: see H&P  I certify that inpatient services furnished can reasonably be expected to improve the patient's condition.   Park Pope, MD 11/19/2021, 6:34 PM  Total Time Spent in Direct Patient Care:  I personally spent 60 minutes on the unit in direct patient care. The direct patient care time included face-to-face time with the patient, reviewing the patient's chart, communicating with other professionals, and coordinating care. Greater than 50% of this time was spent in counseling or coordinating care with the patient regarding goals of hospitalization, psycho-education, and discharge planning needs.  I have independently evaluated the patient during a face-to-face assessment on 11/19/21. I reviewed the patient's chart, and I participated in key portions of the service. I discussed the case with the [employer], and I agree with the assessment and plan of care as documented in the [employer] note, as addended by me or notated below:  I agree with SRA. See H&P for additional history and plan.  Pt is psychotic and has symptoms of mania as well.   Phineas Inches, MD Psychiatrist

## 2021-11-19 NOTE — BH IP Treatment Plan (Signed)
Interdisciplinary Treatment and Diagnostic Plan Update  11/19/2021 Time of Session: 9:45am  Koron Godeaux MRN: [id number]  Principal Diagnosis: Bipolar disorder [facility])  Secondary Diagnoses: Principal Problem:   Bipolar disorder ([place])   Current Medications:  Current Facility-Administered Medications  Medication Dose Route Frequency Provider Last Rate Last Admin   acetaminophen (TYLENOL) tablet 650 mg  650 mg Oral Q6H PRN Prescilla Sours, PA-C   650 mg at 11/19/21 0721   alum & mag hydroxide-simeth (MAALOX/MYLANTA) 200-200-20 MG/5ML suspension 30 mL  30 mL Oral Q4H PRN Prescilla Sours, PA-C       haloperidol lactate (HALDOL) injection 5 mg  5 mg Intramuscular Q6H PRN France Ravens, MD       And   LORazepam (ATIVAN) injection 2 mg  2 mg Intramuscular Q6H PRN France Ravens, MD       And   diphenhydrAMINE (BENADRYL) injection 50 mg  50 mg Intramuscular Q6H PRN France Ravens, MD       divalproex (DEPAKOTE) DR tablet 500 mg  500 mg Oral Q12H France Ravens, MD   500 mg at 11/19/21 1238   gabapentin (NEURONTIN) capsule 200 mg  200 mg Oral TID Prescilla Sours, PA-C   200 mg at 11/19/21 3875   hydrOXYzine (ATARAX) tablet 25 mg  25 mg Oral Q6H PRN Margorie John W, PA-C   25 mg at 11/19/21 1012   influenza vac split quadrivalent PF (FLUARIX) injection 0.5 mL  0.5 mL Intramuscular Tomorrow-1000 Nelda Marseille, Amy E, MD       loperamide (IMODIUM) capsule 2-4 mg  2-4 mg Oral PRN Margorie John W, PA-C       LORazepam (ATIVAN) tablet 1 mg  1 mg Oral Q6H PRN Lovena Le, Cody W, PA-C       OLANZapine zydis (ZYPREXA) disintegrating tablet 10 mg  10 mg Oral Q8H PRN Massengill, Ovid Curd, MD       And   LORazepam (ATIVAN) tablet 1 mg  1 mg Oral PRN Massengill, Ovid Curd, MD       And   ziprasidone (GEODON) injection 20 mg  20 mg Intramuscular PRN Massengill, Ovid Curd, MD       magnesium hydroxide (MILK OF MAGNESIA) suspension 30 mL  30 mL Oral Daily PRN Margorie John W, PA-C       multivitamin with minerals tablet 1 tablet  1 tablet Oral  Daily Margorie John W, PA-C   1 tablet at 11/19/21 0720   OLANZapine (ZYPREXA) tablet 10 mg  10 mg Oral BID France Ravens, MD   10 mg at 11/19/21 1239   ondansetron (ZOFRAN-ODT) disintegrating tablet 4 mg  4 mg Oral Q6H PRN Margorie John W, PA-C       thiamine tablet 100 mg  100 mg Oral Daily Margorie John W, PA-C   100 mg at 11/19/21 0720   traZODone (DESYREL) tablet 50 mg  50 mg Oral QHS PRN Prescilla Sours, PA-C       PTA Medications: Medications Prior to Admission  Medication Sig Dispense Refill Last Dose   ibuprofen (ADVIL) 200 MG tablet Take 400 mg by mouth every 6 (six) hours as needed for headache or moderate pain.       Patient Stressors: Soil scientist issue   Substance abuse   Other: homeless    Patient Strengths: Physical Health  Supportive family/friends   Treatment Modalities: Medication Management, Group therapy, Case management,  1 to 1 session with clinician, Psychoeducation, Recreational therapy.   Physician Treatment Plan  for Primary Diagnosis: Bipolar disorder ([place]) Long Term Goal(s):     Short Term Goals:    Medication Management: Evaluate patient's response, side effects, and tolerance of medication regimen.  Therapeutic Interventions: 1 to 1 sessions, Unit Group sessions and Medication administration.  Evaluation of Outcomes: Not Met  Physician Treatment Plan for Secondary Diagnosis: Principal Problem:   Bipolar disorder ([place])  Long Term Goal(s):     Short Term Goals:       Medication Management: Evaluate patient's response, side effects, and tolerance of medication regimen.  Therapeutic Interventions: 1 to 1 sessions, Unit Group sessions and Medication administration.  Evaluation of Outcomes: Not Met   RN Treatment Plan for Primary Diagnosis: Bipolar disorder ([place]) Long Term Goal(s): Knowledge of disease and therapeutic regimen to maintain health will improve  Short Term Goals: Ability to remain free from injury will improve,  Ability to participate in decision making will improve, Ability to verbalize feelings will improve, Ability to disclose and discuss suicidal ideas, and Ability to identify and develop effective coping behaviors will improve  Medication Management: RN will administer medications as ordered by provider, will assess and evaluate patient's response and provide education to patient for prescribed medication. RN will report any adverse and/or side effects to prescribing provider.  Therapeutic Interventions: 1 on 1 counseling sessions, Psychoeducation, Medication administration, Evaluate responses to treatment, Monitor vital signs and CBGs as ordered, Perform/monitor CIWA, COWS, AIMS and Fall Risk screenings as ordered, Perform wound care treatments as ordered.  Evaluation of Outcomes: Not Met   LCSW Treatment Plan for Primary Diagnosis: Bipolar disorder [facility]) Long Term Goal(s): Safe transition to appropriate next level of care at discharge, Engage patient in therapeutic group addressing interpersonal concerns.  Short Term Goals: Engage patient in aftercare planning with referrals and resources, Increase social support, Increase emotional regulation, Facilitate acceptance of mental health diagnosis and concerns, Identify triggers associated with mental health/substance abuse issues, and Increase skills for wellness and recovery  Therapeutic Interventions: Assess for all discharge needs, 1 to 1 time with Social worker, Explore available resources and support systems, Assess for adequacy in community support network, Educate family and significant other(s) on suicide prevention, Complete Psychosocial Assessment, Interpersonal group therapy.  Evaluation of Outcomes: Not Met   Progress in Treatment: Attending groups: Yes. Participating in groups: Yes. Taking medication as prescribed: Yes. Toleration medication: Yes. Family/Significant other contact made: Yes, individual(s) contacted:  Mother  Patient  understands diagnosis: No. Discussing patient identified problems/goals with staff: Yes. Medical problems stabilized or resolved: Yes. Denies suicidal/homicidal ideation: Yes. Issues/concerns per patient self-inventory: No.   New problem(s) identified: No, Describe:  None   New Short Term/Long Term Goal(s): medication stabilization, elimination of SI thoughts, development of comprehensive mental wellness plan.   Patient Goals: "To get better and to go to groups"   Discharge Plan or Barriers: Patient recently admitted. CSW will continue to follow and assess for appropriate referrals and possible discharge planning.   Reason for Continuation of Hospitalization: Hallucinations Homicidal ideation Medication stabilization Suicidal ideation  Estimated Length of Stay: 3 to 5 days    Scribe for Treatment Team: Carney Harder 11/19/2021 3:23 PM

## 2021-11-19 NOTE — Group Note (Signed)
LCSW Group Therapy Note   Group Date: 11/19/2021 Start Time: 1300 End Time: 1400   Type of Therapy and Topic:  Group Therapy: Boundaries  Participation Level:  Did Not Attend  Description of Group: This group will address the use of boundaries in their personal lives. Patients will explore why boundaries are important, the difference between healthy and unhealthy boundaries, and negative and postive outcomes of different boundaries and will look at how boundaries can be crossed.  Patients will be encouraged to identify current boundaries in their own lives and identify what kind of boundary is being set. Facilitators will guide patients in utilizing problem-solving interventions to address and correct types boundaries being used and to address when no boundary is being used. Understanding and applying boundaries will be explored and addressed for obtaining and maintaining a balanced life. Patients will be encouraged to explore ways to assertively make their boundaries and needs known to significant others in their lives, using other group members and facilitator for role play, support, and feedback.  Therapeutic Goals:  1.  Patient will identify areas in their life where setting clear boundaries could be  used to improve their life.  2.  Patient will identify signs/triggers that a boundary is not being respected. 3.  Patient will identify two ways to set boundaries in order to achieve balance in  their lives: 4.  Patient will demonstrate ability to communicate their needs and set boundaries  through discussion and/or role plays  Summary of Patient Progress:  Did not attend  Therapeutic Modalities:   Cognitive Behavioral Therapy Solution-Focused Therapy  Otelia Santee, LCSW 11/19/2021  1:48 PM

## 2021-11-19 NOTE — [facility] Suicide Risk Assessment (Deleted)
[facility] INPATIENT:  Family/Significant Other Suicide Prevention Education  Suicide Prevention Education:  Patient Refusal for Family/Significant Other Suicide Prevention Education: The patient Maurice [name] has refused to provide written consent for family/significant other to be provided Family/Significant Other Suicide Prevention Education during admission and/or prior to discharge.  Physician notified.  Maurice [name] Hechler 11/19/2021, 11:46 AM
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
## 2021-11-19 NOTE — Progress Notes (Signed)
Woke pt up to make sure he got his 500 mg Depakote, pt has been restless ever since waking up. Pt kept to self in room    11/19/21 2200  Psych Admission Type (Psych Patients Only)  Admission Status Involuntary  Psychosocial Assessment  Patient Complaints Anxiety;Depression  Eye Contact None  Facial Expression Flat  Affect Flat  Speech Slow;Soft  Interaction Forwards little;Guarded;Needy  Motor Activity Slow  Appearance/Hygiene Disheveled  Behavior Characteristics Restless  Mood Depressed;Anxious  Thought Process  Coherency Blocking;Circumstantial  Content UTA  Delusions UTA  Perception WDL  Hallucination None reported or observed  Judgment Poor  Confusion WDL  Danger to Self  Current suicidal ideation? Denies  Danger to Others  Danger to Others Reported or observed  Danger to Others Abnormal  Harmful Behavior to others Threats of violence towards other people observed or expressed   Destructive Behavior Threats of violence towards property observed or expressed

## 2021-11-19 NOTE — Progress Notes (Signed)
Pt has been making threats to other patients since being on 500 hall saying that he is going to pour coffee down a patient's back, using inappropriate language towards other patients and being very intrusive. Pt is being redirected and reminded of the unit rules by Clinical research associate. Pt will continued to be monitored and redirected as needed.

## 2021-11-19 NOTE — Progress Notes (Signed)
Pt sitting in cubby area, talking to attending physician, resident physician and other staff members.  Pt appears to be calm during conversation.
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
## 2021-11-19 NOTE — B[facility] Group Notes (Signed)
Pt didn't attend group. 

## 2021-11-20 NOTE — BHH Counselor (Addendum)
CSW faxed a release of information to [facility] [phone] (Fax Number) and received the Pt's medical records back by return fax on 11/20/2021.  CSW provided the doctor with these medical records which contained copies of the Pt's prescribed medications in 2021.  The doctor will scan in these records or place them in the Pt's chart after review.

## 2021-11-20 NOTE — BHH Counselor (Addendum)
CSW spoke with the [facility] Department [phone] who states that they do not have a warrant for this individual at this time.  They state that if the Pt has a restraining order against them in another state then the warrant may be taken out against them in that state.  CSW will attempt to contact Roque Lias (ex-girlfriend) to discuss this matter further, since the Pt gave the CSW permission to contact this individual.

## 2021-11-20 NOTE — Plan of Care (Signed)
?  Problem: Education: ?Goal: Knowledge of the prescribed therapeutic regimen will improve ?Outcome: Progressing ?  ?Problem: Coping: ?Goal: Coping ability will improve ?Outcome: Progressing ?Goal: Will verbalize feelings ?Outcome: Progressing ?  ?

## 2021-11-20 NOTE — Progress Notes (Signed)
Tried to give pt HS medication , pt would sit up with pills in his hand mumbling, pt appears too lethargic to take the pills, pt kept falling asleep when trying to get pt to take medication

## 2021-11-20 NOTE — Progress Notes (Signed)
Close obs note  Pt continued to be in the dayroom throughout the morning. Pt was seen attempting to expose themselves to an MHT. Pt was easily redirected. Pt continues to respond to internal stimuli. Pt is resting now and missed afternoon group. Pt safe on the unit. Q64m safety checks implemented and continued. Close obs continues while awake. Will continue to monitor.

## 2021-11-20 NOTE — BHH Counselor (Addendum)
CSW attempted to contact Maurice [name] [phone] (Ex-girlfriend).  There was no answer.  CSW left a HIPAA Compliant voicemail asking the individual for a return call.

## 2021-11-20 NOTE — B[facility] Group Notes (Signed)
B[facility] Group Notes:  (Nursing/MHT/Case Management/Adjunct)  Date:  11/20/2021  Time:  10:01 AM  Type of Therapy:   Orientation/Goals group  Participation Level:  Minimal  Participation Quality:  Appropriate  Affect:  Appropriate  Cognitive:  Appropriate  Insight:  Good  Engagement in Group:  Improving and Lacking  Modes of Intervention:  Discussion, Education, and Orientation  Summary of Progress/Problems: Pt goal for today is to make a discharge plan to get back to [place].   Gagan Dillion J Dace Denn 11/20/2021, 10:01 AM

## 2021-11-20 NOTE — Progress Notes (Signed)
°   11/20/21 2100  Psych Admission Type (Psych Patients Only)  Admission Status Involuntary  Psychosocial Assessment  Patient Complaints Confusion  Eye Contact Brief  Facial Expression Anxious;Pensive;Worried  Affect Anxious;Apprehensive;Preoccupied  Speech Soft;Slow;Incoherent  Interaction Avoidant;Cautious;Forwards little;Guarded;Minimal;Isolative  Motor Activity Lethargic  Appearance/Hygiene Disheveled  Behavior Characteristics Resistant to care  Mood Other (Comment) (lethargic)  Thought Process  Coherency Blocking;Circumstantial  Content Paranoia  Delusions Controlled;Paranoid  Perception Hallucinations  Hallucination Auditory  Judgment Poor  Confusion Moderate  Danger to Self  Current suicidal ideation? Denies  Danger to Others  Danger to Others None reported or observed

## 2021-11-20 NOTE — Progress Notes (Signed)
°   11/20/21 0500  Sleep  Number of Hours 8.75

## 2021-11-20 NOTE — BHH Counselor (Addendum)
CSW spoke with the Pt's mother, Nayquan Evinger, again to see which hospital the Pt was seen at during this time in [place].  Mrs. Howland states that her son was seen at a [facility] and provided the number [phone] (CSW attempted this number 4 times and received a busy signal 4 times).  Mrs. Ketchum also provided the Pt with the name of [facility] and states that her son was seen there in December of 2021.  She states that during this time her son was taking an Abilify injection, Lithium Carbonate 300MG , and Buspirone 10MG , along with 2 other medications that she does not know the names of.  CSW will attempt to locate and contact this facility to get the fax number and send over an ROI for medical records to find out what medications the Pt was on at the time.  Mrs. Samons states that the medications that the Pt was on in 2021 to June of 2022 worked well for him.  She states that during that time her son was stable and had a house, car, job, and 2 service dogs.  CSW will provide the doctors with any information once it is received.

## 2021-11-20 NOTE — Progress Notes (Signed)
Close obs note  Pt found in bed; compliant with medication administration. Pt was hesitant on approach and guarded. Pt seemed cautious with daily questions. Pt was seen responding to internal stimuli in the dayroom but denies all to this Clinical research associate. Pt is minimal with peers. Pt denies si/hi/ah/vh and verbally agrees to approach staff if these become apparent or before harming themselves/others while at bhh.  A: Pt provided support and encouragement. Pt given medication per protocol and standing orders. Q41m safety checks implemented and continued.  R: Pt safe on the unit. Will continue to monitor. Close obs continues for safety.
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
## 2021-11-21 DIAGNOSIS — F250 Schizoaffective disorder, bipolar type: Secondary | ICD-10-CM

## 2021-11-21 MED ORDER — TRAZODONE HCL 50 MG PO TABS
50.0000 mg | ORAL_TABLET | Freq: Once | ORAL | Status: AC
  Administered 2021-11-21: 50 mg via ORAL
  Filled 2021-11-21 (×2): qty 1

## 2021-11-21 MED ORDER — ZIPRASIDONE MESYLATE 20 MG IM SOLR: 20.0000 mg | INTRAMUSCULAR | Status: DC | PRN

## 2021-11-21 MED ORDER — OLANZAPINE 5 MG PO TABS
5.0000 mg | ORAL_TABLET | Freq: Two times a day (BID) | ORAL | Status: DC
  Administered 2021-11-22 – 2021-11-26 (×9): 5 mg via ORAL
  Filled 2021-11-21 (×15): qty 1

## 2021-11-21 MED ORDER — BOOST / RESOURCE BREEZE PO LIQD CUSTOM
237.0000 mL | Freq: Two times a day (BID) | ORAL | Status: DC
  Administered 2021-11-21 – 2021-11-22 (×2): 1 via ORAL
  Administered 2021-11-22: 237 mL via ORAL
  Administered 2021-11-23: 15:00:00 1 via ORAL
  Administered 2021-11-24: 14:00:00 237 mL via ORAL
  Administered 2021-11-24: 11:00:00 1 via ORAL
  Administered 2021-11-25: 10:00:00 237 mL via ORAL
  Administered 2021-11-25 – 2021-11-26 (×3): 1 via ORAL
  Administered 2021-11-27: 11:00:00 237 mL via ORAL
  Administered 2021-11-29 – 2021-11-30 (×3): 1 via ORAL
  Administered 2021-12-01: 13:00:00 237 mL via ORAL
  Administered 2021-12-01: 08:00:00 1 via ORAL
  Administered 2021-12-02: 11:00:00 237 mL via ORAL
  Filled 2021-11-21 (×28): qty 1

## 2021-11-21 MED ORDER — OLANZAPINE 10 MG PO TABS
10.0000 mg | ORAL_TABLET | Freq: Every day | ORAL | Status: DC
  Administered 2021-11-21 – 2021-11-25 (×5): 10 mg via ORAL
  Filled 2021-11-21 (×7): qty 1

## 2021-11-21 MED ORDER — GABAPENTIN 300 MG PO CAPS
300.0000 mg | ORAL_CAPSULE | Freq: Three times a day (TID) | ORAL | Status: DC
  Administered 2021-11-21 – 2021-12-02 (×33): 300 mg via ORAL
  Filled 2021-11-21: qty 1
  Filled 2021-11-21: qty 21
  Filled 2021-11-21 (×4): qty 1
  Filled 2021-11-21: qty 21
  Filled 2021-11-21 (×11): qty 1
  Filled 2021-11-21: qty 21
  Filled 2021-11-21 (×5): qty 1
  Filled 2021-11-21: qty 21
  Filled 2021-11-21: qty 1
  Filled 2021-11-21 (×2): qty 21
  Filled 2021-11-21 (×15): qty 1

## 2021-11-21 MED ORDER — OLANZAPINE 10 MG PO TABS
10.0000 mg | ORAL_TABLET | Freq: Two times a day (BID) | ORAL | Status: DC
  Filled 2021-11-21 (×4): qty 1

## 2021-11-21 MED ORDER — OLANZAPINE 5 MG PO TBDP
5.0000 mg | ORAL_TABLET | Freq: Three times a day (TID) | ORAL | Status: DC | PRN
  Administered 2021-11-24: 14:00:00 5 mg via ORAL
  Filled 2021-11-21: qty 1

## 2021-11-21 NOTE — Group Note (Signed)
Date:  11/21/2021 Time:  10:11 AM  Group Topic/Focus:  Goals Group:   The focus of this group is to help patients establish daily goals to achieve during treatment and discuss how the patient can incorporate goal setting into their daily lives to aide in recovery. Orientation:   The focus of this group is to educate the patient on the purpose and policies of crisis stabilization and provide a format to answer questions about their admission.  The group details unit policies and expectations of patients while admitted.    Participation Level:  Did Not Attend  Margaret Pyle 11/21/2021, 10:11 AM

## 2021-11-21 NOTE — Progress Notes (Signed)
Pt awake in dayroom at this time. Presents guarded with flat affect, preoccupied, slow and cautious on interactions. Reports he slept well last night with good appetite "I ate 2 trays this morning and went back to sleep". Denies SI, HI, VH and pain on initial assessment. However, pt reports AH "It's not that bad right now but I hear some voices "the patient took his medications without issues. Visible in milieu at brief intervals and is cooperative with unit routines. Support, encouragement and reassurance provided to pt this shift. All medications administered with verbal education and effects monitored.  Pt is safe on unit. Denies concerns at this time.
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
## 2021-11-21 NOTE — Group Note (Signed)
LCSW Group Therapy Note   Group Date: 11/21/2021 Start Time: 1100 End Time: 1200   Type of Therapy and Topic:  Group Therapy: Challenging Core Beliefs  Participation Level:  None  Description of Group:  Patients were educated about core beliefs and asked to identify one harmful core belief that they have. Patients were asked to explore from where those beliefs originate. Patients were asked to discuss how those beliefs make them feel and the resulting behaviors of those beliefs. They were then be asked if those beliefs are true and, if so, what evidence they have to support them. Lastly, group members were challenged to replace those negative core beliefs with helpful beliefs.   Therapeutic Goals:   1. Patient will identify harmful core beliefs and explore the origins of such beliefs. 2. Patient will identify feelings and behaviors that result from those core beliefs. 3. Patient will discuss whether such beliefs are true. 4.  Patient will replace harmful core beliefs with helpful ones.  Summary of Patient Progress:  Maurice [name] did not participate in group discussion and left the group early.  Therapeutic Modalities: Cognitive Behavioral Therapy; Solution-Focused Therapy   Otelia Santee, LCSW 11/21/2021  11:45 AM

## 2021-11-21 NOTE — Progress Notes (Signed)
Nursing 1:1 note D:Pt observed sleeping walking in the hallway. Pt states that he cannot go to sleep. "I need a sedative." Provider notified for another dose of PRN sleep medication. A: 1:1 observation continues for safety. Order placed by provider.  R: Pt remains safe
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
## 2021-11-21 NOTE — B[facility] Group Notes (Signed)
Pt didn't attend group. 

## 2021-11-21 NOTE — Progress Notes (Signed)
°   11/21/21 0500  Sleep  Number of Hours 10.5

## 2021-11-21 NOTE — Progress Notes (Signed)
Pt received PRN Tylenol 650 mg PO at 1522 for complain of left shoulder pain 6/10 and PRN Vistaril 25 mg PO for anxiety 5/10 at 1734. Reports relief from pain 0/10 and anxiety 0/10 when reassessed. Pt tolerated all medications and meals well. Remains safe on unit without behavioral outburst. Continued support and encouragement provided to pt. Assigned staff in attendance at all times. Pt awake eating dinner without issues.

## 2021-11-21 NOTE — Progress Notes (Signed)
Pt asleep at this time. Respirations noted and unlabored. Close observation maintained when awake as ordered. Pt appears to be in no physical distress at this time.

## 2021-11-22 DIAGNOSIS — F250 Schizoaffective disorder, bipolar type: Secondary | ICD-10-CM | POA: Diagnosis not present

## 2021-11-22 LAB — RESP PANEL BY RT-PCR (FLU A&B, COVID) ARPGX2
Influenza A by PCR: NEGATIVE
Influenza B by PCR: NEGATIVE
SARS Coronavirus 2 by RT PCR: NEGATIVE

## 2021-11-22 MED ORDER — IBUPROFEN 600 MG PO TABS
600.0000 mg | ORAL_TABLET | Freq: Four times a day (QID) | ORAL | Status: DC | PRN
  Administered 2021-11-22 – 2021-11-23 (×2): 600 mg via ORAL
  Filled 2021-11-22 (×2): qty 1

## 2021-11-22 MED ORDER — ONDANSETRON 4 MG PO TBDP: 8.0000 mg | ORAL_TABLET | Freq: Three times a day (TID) | ORAL | Status: DC | PRN

## 2021-11-22 MED ORDER — INFLUENZA VAC SPLIT QUAD 0.5 ML IM SUSY: 0.5000 mL | PREFILLED_SYRINGE | INTRAMUSCULAR | Status: DC

## 2021-11-22 MED ORDER — INFLUENZA VAC SPLIT QUAD 0.5 ML IM SUSY
0.5000 mL | PREFILLED_SYRINGE | Freq: Once | INTRAMUSCULAR | Status: AC
  Administered 2021-11-22: 0.5 mL via INTRAMUSCULAR
  Filled 2021-11-22: qty 0.5

## 2021-11-22 NOTE — Progress Notes (Signed)
°   11/22/21 2100  Psych Admission Type (Psych Patients Only)  Admission Status Involuntary  Psychosocial Assessment  Patient Complaints Anxiety;Depression;Other (Comment) (acute pain in left shoulder)  Eye Contact Avertive  Facial Expression Anxious  Affect Anxious;Preoccupied  Speech Soft;Slow  Interaction Cautious;Forwards little;Guarded  Motor Activity Slow  Appearance/Hygiene Unremarkable  Behavior Characteristics Cooperative;Anxious;Guarded  Mood Anxious;Preoccupied  Thought Process  Coherency Blocking;Circumstantial  Content WDL  Delusions None reported or observed  Perception Hallucinations  Hallucination None reported or observed  Judgment Poor  Confusion Mild  Danger to Self  Current suicidal ideation? Denies  Danger to Others  Danger to Others None reported or observed   Pt in company of sitter. Pt denies SI, HI, AVH. Pt denies pain at first but when asked about his shoulder, says that pain is 8/10 ("It hurts like hell"). Pt rates anxiety 8/10 and depression 7/10. Pt suspicious and guarded. Takes meds appropriately.

## 2021-11-22 NOTE — [facility] Group Notes (Signed)
Goals Group °11/22/21 ° ° ° °Group Focus: affirmation, clarity of thought, and goals/reality orientation °Treatment Modality:  Psychoeducation °Interventions utilized were assignment, group exercise, and support °Purpose: To be able to understand and verbalize the reason for their admission to the hospital. To understand that the medication helps with their chemical imbalance but they also need to work on their choices in life. To be challenged to develop a list of 30 positives about themselves. Also introduce the concept that "feelings" are not reality. ° °Participation Level:  did not attend °Maurice [name] A °

## 2021-11-22 NOTE — Progress Notes (Signed)
Pt A &O to self, place and situation. Denies SI, HI and AVH when assessed. Received PRN Tylenol at 0810 for complain of left shoulder pain, reported relief when reassessed at 0900. Observed to be suspicious, constantly watching behind himself in dayroom during groups noting peers' location when entering dayroom as well.  Support, reassurance and encouragement provided to pt this shift. Close observation maintained when awake. Assigned M[facility] staff in attendance. Pt is safe in milieu without self ham gestures. All medications administered as ordered and effects monitored.  Pt tolerates breakfast well. Cooperative with care at this time.

## 2021-11-22 NOTE — Progress Notes (Signed)
Nursing 1:1 note D:Pt observed sleeping in bed with eyes closed. RR even and unlabored. No distress noted. A: 1:1 observation continues for safety  R: Pt remains safe  

## 2021-11-22 NOTE — Progress Notes (Addendum)
[facility] MD Progress Note  11/22/2021 9:34 AM Maurice [name]  MRN:  [id number] Reason for Admission:  Patient is a 35 year old male with reported psychiatric history of schizophrenia, bipolar disorder, heroin use disorder, cocaine use disorder, alcohol use disorder presents to behavioral health hospital from [facility], ED due to suicidal ideation. Principal Problem: Schizoaffective disorder, bipolar type ([name]) Diagnosis: Principal Problem:   Schizoaffective disorder, bipolar type ([place])  Chart Review, 24 hr Events: The patient's chart was reviewed and nursing notes were reviewed. The patient's case was discussed in multidisciplinary team meeting.  Per MAR: - Patient is compliant with scheduled meds. - PRNs: Tylenol x1, Trazodone 50 mg x1  Per RN notes, no documented behavioral issues and is not attending group. Patient slept, 6.5 hours  Psychiatry team made following recommendations yesterday: -CHANGE Zyprexa to 5 mg bid (8:00 and 14:00) and 10 mg at night for psychotic symptoms and mood lability -CONTINUE Depakote DR 500 bid for mood lability, VPA level for Monday -Continue Gabapentin 200 mg tid for anxiety and alcohol craving -Agitation protocol: haldol, benadryl, ativan -CIWA monitoring w/o ativan -Will continue to monitor for opiate/alcohol withdrawal -Thiamine/Multivitamin tablet for nutriitional support    Subjective Patient was seen and staffed with attending Dr. Berdine Addison.  Patient reports that he is doing okay today, sleeping and eating well. Patient was even less tangential today. Patient reports his mood is good. Patient appeared less sleepy and was easily awoken. He reports less sedation on the split Zyprexa doses. Patient continues to report he has some MSK pain in shoulder, leg and chest and some nausea but denies all other somatic sx. Patient continues to report that the Neurontin helps with pain, particularly at the increased dose. His concern for today is that his "demands" have not  been met: Clothing, vegan menu, flu shot, and his toxicology results. We talked about each of these items, and were in agreement that he could see the toxicology results and be given a flu shot. Patient believes current medication regiment is good for him.  Patient denies suicidal ideation and AVH; however, patient reports homicidal ideation towards other patients on the unit but said that he would try to let someone know before acting upon his thoughts.  Discussed plan to monitor patient and adjust medications accordingly.   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[place])    Medical history non-contributory    Schizophrenia ([place])     Past Surgical History:  Procedure Laterality Date   NO PAST SURGERIES     Family Psychiatric  History: see H&P Social History:  Social History   Substance and Sexual Activity  Alcohol Use Yes   Comment: pt states drinks a gallon of liquor and beer mixed daily     Social History   Substance and Sexual Activity  Drug Use Yes   Types: Cocaine    Social History   Socioeconomic History   Marital status: Single    Spouse name: Not on file   Number of children: Not on file   Years of education: Not on file   Highest education level: Not on file  Occupational History   Not on file  Tobacco Use   Smoking status: Some Days    Types: Cigarettes   Smokeless tobacco: Never  Vaping Use   Vaping Use: Never used  Substance and  Sexual Activity   Alcohol use: Yes    Comment: pt states drinks a gallon of liquor and beer mixed daily   Drug use: Yes    Types: Cocaine   Sexual activity: Not Currently  Other Topics Concern   Not on file  Social History Narrative   Not on file   Social Determinants of Health   Financial Resource Strain: Not on file  Food Insecurity: Not on file  Transportation  Needs: Not on file  Physical Activity: Not on file  Stress: Not on file  Social Connections: Not on file   Additional Social History:         Sleep: Good  Appetite:  Good  Current Medications: Current Facility-Administered Medications  Medication Dose Route Frequency Provider Last Rate Last Admin   acetaminophen (TYLENOL) tablet 650 mg  650 mg Oral Q6H PRN Prescilla Sours, PA-C   650 mg at 11/22/21 0810   alum & mag hydroxide-simeth (MAALOX/MYLANTA) 200-200-20 MG/5ML suspension 30 mL  30 mL Oral Q4H PRN Margorie John W, PA-C       haloperidol lactate (HALDOL) injection 5 mg  5 mg Intramuscular Q6H PRN France Ravens, MD       And   LORazepam (ATIVAN) injection 2 mg  2 mg Intramuscular Q6H PRN France Ravens, MD       And   diphenhydrAMINE (BENADRYL) injection 50 mg  50 mg Intramuscular Q6H PRN France Ravens, MD       divalproex (DEPAKOTE) DR tablet 500 mg  500 mg Oral Q12H France Ravens, MD   500 mg at 11/22/21 0809   feeding supplement (BOOST / RESOURCE BREEZE) liquid 1 Container  237 mL Oral BID BM France Ravens, MD   1 Container at 11/21/21 1524   gabapentin (NEURONTIN) capsule 300 mg  300 mg Oral TID Janine Limbo, MD   300 mg at 11/22/21 0809   influenza vac split quadrivalent PF (FLUARIX) injection 0.5 mL  0.5 mL Intramuscular Tomorrow-1000 Nelda Marseille, Amy E, MD       magnesium hydroxide (MILK OF MAGNESIA) suspension 30 mL  30 mL Oral Daily PRN Margorie John W, PA-C       multivitamin with minerals tablet 1 tablet  1 tablet Oral Daily Margorie John W, PA-C   1 tablet at 11/22/21 0809   OLANZapine (ZYPREXA) tablet 5 mg  5 mg Oral BID France Ravens, MD   5 mg at 11/22/21 0809   And   OLANZapine (ZYPREXA) tablet 10 mg  10 mg Oral Aliene Altes, MD   10 mg at 11/21/21 2035   OLANZapine zydis (ZYPREXA) disintegrating tablet 5 mg  5 mg Oral Q8H PRN Massengill, Ovid Curd, MD       thiamine tablet 100 mg  100 mg Oral Daily Margorie John W, PA-C   100 mg at 11/22/21 0809   traZODone (DESYREL) tablet 50 mg   50 mg Oral QHS PRN Prescilla Sours, PA-C   50 mg at 11/21/21 2035    Lab Results:  No results found for this or any previous visit (from the past 62 hour(s)).   Blood Alcohol level:  Lab Results  Component Value Date   ETH <10 08/67/6195    Metabolic Disorder Labs: Lab Results  Component Value Date   HGBA1C 4.8 11/19/2021   MPG 91.06 11/19/2021   No results found for: PROLACTIN Lab Results  Component Value Date   CHOL 138 11/19/2021   TRIG 249 (H) 11/19/2021   HDL 43 11/19/2021  CHOLHDL 3.2 11/19/2021   VLDL 50 (H) 11/19/2021   LDLCALC 45 11/19/2021    Physical Findings: AIMS: Facial and Oral Movements Muscles of Facial Expression: None, normal Lips and Perioral Area: None, normal Jaw: None, normal Tongue: None, normal,Extremity Movements Upper (arms, wrists, hands, fingers): None, normal Lower (legs, knees, ankles, toes): None, normal, Trunk Movements Neck, shoulders, hips: None, normal, Overall Severity Severity of abnormal movements (highest score from questions above): None, normal Incapacitation due to abnormal movements: None, normal Patient's awareness of abnormal movements (rate only patient's report): No Awareness, Dental Status Current problems with teeth and/or dentures?: No Does patient usually wear dentures?: No  CIWA:  CIWA-Ar Total: 1  Musculoskeletal: Strength & Muscle Tone: within normal limits Gait & Station: normal Patient leans: N/A  Psychiatric Specialty Exam:  Presentation  General Appearance: Bizarre; Casual  Eye Contact:Fair  Speech: Less pressured Speech Volume:Normal  Handedness:Right   Mood and Affect  Mood:Dysphoric; Labile  Affect:Inappropriate; Labile   Thought Process  Thought Processes:Disorganized; Irrevelant  Descriptions of Associations:Tangential But less Orientation:Partial  Thought Content:Illogical; Scattered  History of Schizophrenia/Schizoaffective disorder: Endorses Duration of Psychotic  Symptoms: Greater than 6 months Hallucinations: denies  Ideas of Reference:None  Suicidal Thoughts: None reported today  Homicidal Thoughts: Passive homicidal ideation but does not specify plan or intent  Sensorium  Memory:Immediate Fair; Remote Poor; Recent Poor  Judgment:Impaired  Insight: fair, as he is compliant with medication  Executive Functions  Concentration:Fair  Attention Span:Fair  [place] of Knowledge:Poor  Language:Good   Psychomotor Activity  Psychomotor Activity:Normal   Assets  Assets:Resilience   Sleep  Sleep: 6.5 hours    Physical Exam: Physical Exam Vitals and nursing note reviewed.  Constitutional:      Appearance: Normal appearance. He is normal weight.  HENT:     Head: Normocephalic and atraumatic.  Pulmonary:     Effort: Pulmonary effort is normal.  Neurological:     General: No focal deficit present.     Mental Status: He is oriented to person, place, and time.   Review of Systems  Respiratory:  Negative for shortness of breath.   Cardiovascular:  Negative for chest pain.  Gastrointestinal:  Positive for nausea. Negative for abdominal pain, constipation, diarrhea and heartburn.  Musculoskeletal:  Positive for myalgias.  Neurological:  Negative for headaches.  Blood pressure 110/72, pulse 100, temperature 98.2 F (36.8 C), temperature source Oral, resp. rate 16, height 5' 7.5" (1.715 m), weight 78.1 kg, SpO2 99 %. Body mass index is 26.57 kg/m.   Treatment Plan Summary: Daily contact with patient to assess and evaluate symptoms and progress in treatment and Medication management  PLAN Safety and Monitoring: Involuntary admission to inpatient psychiatric unit for safety, stabilization and treatment Daily contact with patient to assess and evaluate symptoms and progress in treatment Patient's case to be discussed in multi-disciplinary team meeting Observation Level : q15 minute checks Vital signs: q12  hours Precautions: suicide, elopement, and assault     Psychiatric Problems Schizophrenia (r/o bipolar disorder, r/o substance induced psychosis, r/o antisocial personality disorder, r/o MDD with psychosis) Alcohol Use Disorder-Severe -Continue Zyprexa to 5 mg bid (8:00 and 14:00) and 10 mg at night for psychotic symptoms and mood lability -CONTINUE Depakote DR 500 bid for mood lability, VPA level for Monday -Continue Gabapentin 200 mg tid for anxiety and alcohol craving -Agitation protocol: haldol, benadryl, ativan -CIWA monitoring w/o ativan -Will continue to monitor for opiate/alcohol withdrawal -Thiamine/Multivitamin tablet for nutriitional support   Medical Problems TSH  wnl, Lipid panel abnormal for triglycerides 249 and VLDL 50 but does not appear to be fasting, A1c wnl -Patient to be given influenza vaccination, per request.  -COVID swab to be obtained, per request.   PRNs Tylenol 650 mg for mild pain Maalox/Mylanta 30 mL for indigestion Hydroxyzine 25 mg tid for anxiety Milk of Magnesia 30 mL for constipation Trazodone 50 mg for sleep Zofran for nausea/vomiting   4. Discharge Planning: Social work and case management to assist with discharge planning and identification of hospital follow-up needs prior to discharge Estimated LOS: 5-7 days Discharge Concerns: Need to establish a safety plan; Medication compliance and effectiveness Discharge Goals: Return home with outpatient referrals for mental health follow-up including medication management/psychotherapy  Rosezetta Schlatter, MD 11/22/2021, 9:34 AM

## 2021-11-22 NOTE — Progress Notes (Addendum)
Pt required multiple verbal redirections this afternoon from going through trash for food despite getting 2-3 trays for lunch and breakfast. Gets irritated when redirected "well I don't remember you telling me that, I forgot".  Forwards on interactions this afternoon during medication pass. Elaborated on events leading to him moving to [place] as well as educationa, career concerns "I met a Hispanic lady online. Her husband is a long distance truck driver and she wanted me to come stay with her. Now I need to discharge so I can get back to [place] because I got 2 girls pregnant and I need to take care of things. 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 I went to college in [place] and I need to get my stuff done". Continued support offered to pt. Verbal education done in regards to discharge process. Pt tried off unit for lunch and did well, returned without issues.

## 2021-11-22 NOTE — Progress Notes (Addendum)
Pt denies active signs of withdrawal with CIWA scores of 0 this shift "nothing right now" when assessed at noon and this evening. Visible in bed awake. Tried off unit for dinner as well and returned without issues. Received PRN Tylenol 650 mg PO at 1718 for left shoulder pain 5/10. Reports relief when reassessed at 1800. Close observation maintained. Assigned MHT staff in attendance. Pt denies concerns at this time. Remains medication compliant.

## 2021-11-22 NOTE — [facility] Counselor (Signed)
Clinical Social Work Note  CSW and co-worker were asked by patient to answer some questions on a 1:1 basis, met in his room with him briefly.  He did keep trying to ask questions during the group, had to be redirected multiple times.  He expressed concern about how to get to [facility] when he is discharged, asked if social work staff will be able to help with a bus ticket.  He stated [place] lives [place] specific bridge with 2 friends in [place].  He also said he has no ID, thus will have difficulty getting on a bus.  CSW agreed to alert weekday staff of this transportation need as well as the problem with appropriate identification documents.  Selmer Dominion, LCSW 11/22/2021, 3:03 PM

## 2021-11-22 NOTE — [facility] Group Notes (Signed)
Adult Psychoeducational Group Note  Date:  11/22/2021 Time:  9:19 PM  Group Topic/Focus:  Wrap-Up Group:   The focus of this group is to help patients review their daily goal of treatment and discuss progress on daily workbooks.  Participation Level:  Active  Participation Quality:  Intrusive and Inattentive  Affect:  Appropriate  Cognitive:  Disorganized  Insight: Lacking  Engagement in Group:  Distracting  Modes of Intervention:  Discussion and Education  Additional Comments:  Pt attended and participated in wrapup group this evening and rated their day a 10/10. Pt stated that they enjoyed talking and joking with their peers. Pt goal is to listen to the DR and SW and follow their recommendations.   Chrisandra Netters 11/22/2021, 9:19 PM

## 2021-11-22 NOTE — Progress Notes (Signed)
Pt up and asking for something else for sleep. Pt told that sleep meds not given after 2 am. Pt then asked for a snack. Received snack and went back to room.

## 2021-11-22 NOTE — Progress Notes (Signed)
Nursing 1:1 note D:Pt observed laying in bed with eyes closed. Pt seems restless. RR even and unlabored. A: 1:1 observation continues for safety  R: Pt remains safe

## 2021-11-22 NOTE — Group Note (Signed)
°  [facility] LCSW Group Therapy Note  Date/Time:  11/22/2021 11:15AM-12:00PM  Type of Therapy and Topic:  Group Therapy:  Feelings About Hospitalization  Participation Level:  Active   Description of Group This process group involved patients discussing their feelings related to being hospitalized, as well as the benefits they see to being in the hospital.  These feelings and benefits were itemized.  The group then brainstormed specific ways in which they could seek those same benefits when they discharge and return home.  Therapeutic Goals Patient will identify and describe positive and negative feelings related to hospitalization Patient will verbalize benefits of hospitalization to themselves personally Patients will brainstorm together ways they can obtain similar benefits in the outpatient setting, identify barriers to wellness and possible solutions  Summary of Patient Progress:  The patient expressed his primary feelings about being hospitalized are "mad" because he has not gotten his flu shot, the rest of his clothing, a vegan menu, or a list of any type of menu in advance of meals.  He spoke out loud to himself on numerous occasions and had to be asked to stop.  He was off topic much of the time.  He tried to insist on allowing another patient to keep speaking at length off topic, had to be reminded he is not a staff member.  He was pleasant but not engaged in the actual group process.  Therapeutic Modalities Cognitive Behavioral Therapy Motivational Interviewing    Ambrose Mantle, LCSW 11/22/2021, 3:44 PM

## 2021-11-23 ENCOUNTER — Inpatient Hospital Stay (HOSPITAL_COMMUNITY): Payer: Medicare HMO

## 2021-11-23 LAB — CBC
HCT: 40.2 % (ref 39.0–52.0)
Hemoglobin: 13.3 g/dL (ref 13.0–17.0)
MCH: 31.1 pg (ref 26.0–34.0)
MCHC: 33.1 g/dL (ref 30.0–36.0)
MCV: 93.9 fL (ref 80.0–100.0)
Platelets: 158 10*3/uL (ref 150–400)
RBC: 4.28 MIL/uL (ref 4.22–5.81)
RDW: 12.7 % (ref 11.5–15.5)
WBC: 4.9 10*3/uL (ref 4.0–10.5)
nRBC: 0 % (ref 0.0–0.2)

## 2021-11-23 LAB — COMPREHENSIVE METABOLIC PANEL
ALT: 74 U/L — ABNORMAL HIGH (ref 0–44)
AST: 67 U/L — ABNORMAL HIGH (ref 15–41)
Albumin: 3.7 g/dL (ref 3.5–5.0)
Alkaline Phosphatase: 86 U/L (ref 38–126)
Anion gap: 9 (ref 5–15)
BUN: 15 mg/dL (ref 6–20)
CO2: 27 mmol/L (ref 22–32)
Calcium: 9.2 mg/dL (ref 8.9–10.3)
Chloride: 103 mmol/L (ref 98–111)
Creatinine, Ser: 1.02 mg/dL (ref 0.61–1.24)
GFR, Estimated: >60 mL/min (ref >60)
Glucose, Bld: 148 mg/dL — ABNORMAL HIGH (ref 70–99)
Potassium: 4.2 mmol/L (ref 3.5–5.1)
Sodium: 139 mmol/L (ref 135–145)
Total Bilirubin: 0.8 mg/dL (ref 0.3–1.2)
Total Protein: 6.4 g/dL — ABNORMAL LOW (ref 6.5–8.1)

## 2021-11-23 LAB — CK: Total CK: 131 U/L (ref 49–397)

## 2021-11-23 LAB — VALPROIC ACID LEVEL: Valproic Acid Lvl: 36 ug/mL — ABNORMAL LOW (ref 50.0–100.0)

## 2021-11-23 MED ORDER — TRAZODONE HCL 50 MG PO TABS
50.0000 mg | ORAL_TABLET | Freq: Once | ORAL | Status: AC
  Administered 2021-11-24: 01:00:00 50 mg via ORAL
  Filled 2021-11-23 (×2): qty 1

## 2021-11-23 MED ORDER — LOPERAMIDE HCL 2 MG PO CAPS
2.0000 mg | ORAL_CAPSULE | ORAL | Status: DC | PRN
  Administered 2021-11-23 – 2021-11-28 (×5): 2 mg via ORAL
  Filled 2021-11-23 (×5): qty 1

## 2021-11-23 MED ORDER — IBUPROFEN 800 MG PO TABS
800.0000 mg | ORAL_TABLET | Freq: Three times a day (TID) | ORAL | Status: DC | PRN
  Administered 2021-11-23 – 2021-12-01 (×8): 800 mg via ORAL
  Filled 2021-11-23 (×8): qty 1

## 2021-11-23 MED ORDER — ACETAMINOPHEN 500 MG PO TABS
1000.0000 mg | ORAL_TABLET | Freq: Two times a day (BID) | ORAL | Status: DC
  Administered 2021-11-23 – 2021-11-28 (×11): 1000 mg via ORAL
  Filled 2021-11-23 (×14): qty 2

## 2021-11-23 NOTE — Progress Notes (Signed)
Nursing 1:1 note D:Pt observed standing at dayroom door. Pt wanting something to drink. Pt walked back to room by sitter. Pt sitting on side of bed. A: 1:1 observation continues for safety  R: Pt remains safe

## 2021-11-23 NOTE — Group Note (Signed)
[facility] LCSW Group Therapy Note  Date/Time:  11/23/2021  11:00AM-12:00PM  Type of Therapy and Topic:  Group Therapy:  Music and Mood  Participation Level:  None   Description of Group: In this process group, members listened to a variety of genres of music and identified that different types of music evoke different responses.  Patients were encouraged to identify music that was soothing for them and music that was energizing for them.  Patients discussed how this knowledge can help with wellness and recovery in various ways including managing depression and anxiety as well as encouraging healthy sleep habits.    Therapeutic Goals: Patients will explore the impact of different varieties of music on mood Patients will verbalize the thoughts they have when listening to different types of music Patients will identify music that is soothing to them as well as music that is energizing to them Patients will discuss how to use this knowledge to assist in maintaining wellness and recovery Patients will explore the use of music as a coping skill  Summary of Patient Progress:  Patient did not arrive into the group until the last 2 songs (of 12) were played.  He talked to himself throughout those songs.  He stated at the end of group that he was "aggravated at the atmosphere, but not at the music."  Therapeutic Modalities: Solution Focused Brief Therapy Activity   Maurice Mantle, LCSW

## 2021-11-23 NOTE — Progress Notes (Signed)
[facility] MD Progress Note  11/23/2021 11:40 AM Maurice [name]  MRN:  [id number] Reason for Admission:  Patient is a 35 year old male with reported psychiatric history of schizophrenia, bipolar disorder, heroin use disorder, cocaine use disorder, alcohol use disorder presents to behavioral health hospital from [facility], ED due to suicidal ideation. Principal Problem: Schizoaffective disorder, bipolar type ([place]) Diagnosis: Principal Problem:   Schizoaffective disorder, bipolar type ([place])  Chart Review, 24 hr Events: The patient's chart was reviewed and nursing notes were reviewed. The patient's case was discussed in multidisciplinary team meeting.  Per MAR: - Patient is compliant with scheduled meds. - PRNs: Tylenol x1, Trazodone 50 mg x1  Per RN notes, no documented behavioral issues and is not attending group. Patient slept, unknown hours  Psychiatry team made following recommendations yesterday: -Continue Zyprexa to 5 mg bid (8:00 and 14:00) and 10 mg at night for psychotic symptoms and mood lability -CONTINUE Depakote DR 500 bid for mood lability, VPA level for Monday -Continue Gabapentin 200 mg tid for anxiety and alcohol craving -Agitation protocol: haldol, benadryl, ativan -CIWA monitoring w/o ativan -Will continue to monitor for opiate/alcohol withdrawal -Thiamine/Multivitamin tablet for nutriitional support      Subjective Patient was seen and staffed with attending Dr. Berdine Addison.  Patient reports to doing ok today.  Patient reports he has been sleeping well and eating well.  Patient reports primary complaint at this time is his shoulder pain and requesting increase in his Advil dosage for this.  Patient feels that his mood has been improving and denies homicidal and suicidal ideation today.  Patient denies auditory hallucinations but endorses visual hallucinations reporting that he "sees" peoples problems and difficulties.  When asked what the physical manifestation of this is, patient  reports he is unable to clearly describe them.  Patient does state that he is specifically able to identify them better than anyone else can.  Patient denies ideas of reference, thought insertion/extraction.  Patient does report mild restlessness leading to patient pacing occasionally in the hallway per MHT.  Discussed that we would increase Advil dosage to accommodate left shoulder pain as well as scheduling Tylenol for this as well.  In addition, it appears that patient did not receive a shoulder x-ray this morning so we will order one and see if there is any fractures or concerning findings and address them once x-ray has resulted.  Patient verbalized agreement and understanding and had no other questions at this time.  Patient reports he plans to return to [employer] following discharge.  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[place])    Medical history non-contributory    Schizophrenia ([place])     Past Surgical History:  Procedure Laterality Date   NO PAST SURGERIES     Family Psychiatric  History: see H&P Social History:  Social History   Substance and Sexual Activity  Alcohol Use Yes   Comment: pt states drinks a gallon of liquor and beer mixed daily     Social History   Substance and Sexual Activity  Drug Use Yes   Types: Cocaine    Social History   Socioeconomic History   Marital status: Single    Spouse name: Not on file   Number of children: Not on file   Years of education: Not on file   Highest education level: Not on  file  Occupational History   Not on file  Tobacco Use   Smoking status: Some Days    Types: Cigarettes   Smokeless tobacco: Never  Vaping Use   Vaping Use: Never used  Substance and Sexual Activity   Alcohol use: Yes    Comment: pt states drinks a gallon of liquor and beer mixed daily   Drug  use: Yes    Types: Cocaine   Sexual activity: Not Currently  Other Topics Concern   Not on file  Social History Narrative   Not on file   Social Determinants of Health   Financial Resource Strain: Not on file  Food Insecurity: Not on file  Transportation Needs: Not on file  Physical Activity: Not on file  Stress: Not on file  Social Connections: Not on file   Additional Social History:         Sleep: Good  Appetite:  Good  Current Medications: Current Facility-Administered Medications  Medication Dose Route Frequency Provider Last Rate Last Admin   acetaminophen (TYLENOL) tablet 1,000 mg  1,000 mg Oral BID France Ravens, MD   1,000 mg at 11/23/21 1012   alum & mag hydroxide-simeth (MAALOX/MYLANTA) 200-200-20 MG/5ML suspension 30 mL  30 mL Oral Q4H PRN Margorie John W, PA-C       haloperidol lactate (HALDOL) injection 5 mg  5 mg Intramuscular Q6H PRN France Ravens, MD       And   LORazepam (ATIVAN) injection 2 mg  2 mg Intramuscular Q6H PRN France Ravens, MD       And   diphenhydrAMINE (BENADRYL) injection 50 mg  50 mg Intramuscular Q6H PRN France Ravens, MD       divalproex (DEPAKOTE) DR tablet 500 mg  500 mg Oral Q12H France Ravens, MD   500 mg at 11/23/21 [id number]   feeding supplement (BOOST / RESOURCE BREEZE) liquid 1 Container  237 mL Oral BID BM France Ravens, MD   1 Container at 11/22/21 1439   gabapentin (NEURONTIN) capsule 300 mg  300 mg Oral TID Janine Limbo, MD   300 mg at 11/23/21 0751   ibuprofen (ADVIL) tablet 800 mg  800 mg Oral Q8H PRN France Ravens, MD       influenza vac split quadrivalent PF (FLUARIX) injection 0.5 mL  0.5 mL Intramuscular Tomorrow-1000 Nelda Marseille, Amy E, MD       magnesium hydroxide (MILK OF MAGNESIA) suspension 30 mL  30 mL Oral Daily PRN Margorie John W, PA-C       multivitamin with minerals tablet 1 tablet  1 tablet Oral Daily Margorie John W, PA-C   1 tablet at 11/23/21 0751   OLANZapine (ZYPREXA) tablet 5 mg  5 mg Oral BID France Ravens, MD   5 mg at 11/23/21  0751   And   OLANZapine (ZYPREXA) tablet 10 mg  10 mg Oral Aliene Altes, MD   10 mg at 11/22/21 2100   OLANZapine zydis (ZYPREXA) disintegrating tablet 5 mg  5 mg Oral Q8H PRN Massengill, Ovid Curd, MD       ondansetron (ZOFRAN-ODT) disintegrating tablet 8 mg  8 mg Oral Q8H PRN Rosezetta Schlatter, MD       thiamine tablet 100 mg  100 mg Oral Daily Margorie John W, PA-C   100 mg at 11/23/21 [id number]   traZODone (DESYREL) tablet 50 mg  50 mg Oral QHS PRN Prescilla Sours, PA-C   50 mg at 11/22/21 2100    Lab Results:  Results for orders  placed or performed during the hospital encounter of 11/18/21 (from the past 48 hour(s))  Resp Panel by RT-PCR (Flu A&B, Covid) Nasopharyngeal Swab     Status: None   Collection Time: 11/22/21 11:43 AM   Specimen: Nasopharyngeal Swab; Nasopharyngeal(NP) swabs in vial transport medium  Result Value Ref Range   SARS Coronavirus 2 by RT PCR NEGATIVE NEGATIVE    Comment: (NOTE) SARS-CoV-2 target nucleic acids are NOT DETECTED.  The SARS-CoV-2 RNA is generally detectable in upper respiratory specimens during the acute phase of infection. The lowest concentration of SARS-CoV-2 viral copies this assay can detect is 138 copies/mL. A negative result does not preclude SARS-Cov-2 infection and should not be used as the sole basis for treatment or other patient management decisions. A negative result may occur with  improper specimen collection/handling, submission of specimen other than nasopharyngeal swab, presence of viral mutation(s) within the areas targeted by this assay, and inadequate number of viral copies(<138 copies/mL). A negative result must be combined with clinical observations, patient history, and epidemiological information. The expected result is Negative.  Fact Sheet for Patients:  EntrepreneurPulse.com.au  Fact Sheet for Healthcare Providers:  IncredibleEmployment.be  This test is no t yet approved or cleared by the  [place] FDA and  has been authorized for detection and/or diagnosis of SARS-CoV-2 by FDA under an Emergency Use Authorization (EUA). This EUA will remain  in effect (meaning this test can be used) for the duration of the COVID-19 declaration under Section 564(b)(1) of the Act, 21 U.S.C.section 360bbb-3(b)(1), unless the authorization is terminated  or revoked sooner.       Influenza A by PCR NEGATIVE NEGATIVE   Influenza B by PCR NEGATIVE NEGATIVE    Comment: (NOTE) The Xpert Xpress SARS-CoV-2/FLU/RSV plus assay is intended as an aid in the diagnosis of influenza from Nasopharyngeal swab specimens and should not be used as a sole basis for treatment. Nasal washings and aspirates are unacceptable for Xpert Xpress SARS-CoV-2/FLU/RSV testing.  Fact Sheet for Patients: EntrepreneurPulse.com.au  Fact Sheet for Healthcare Providers: IncredibleEmployment.be  This test is not yet approved or cleared by the [place] FDA and has been authorized for detection and/or diagnosis of SARS-CoV-2 by FDA under an Emergency Use Authorization (EUA). This EUA will remain in effect (meaning this test can be used) for the duration of the COVID-19 declaration under Section 564(b)(1) of the Act, 21 U.S.C. section 360bbb-3(b)(1), unless the authorization is terminated or revoked.  Performed at [facility], [place] [address], [place], [place] [address]      Blood Alcohol level:  Lab Results  Component Value Date   ETH <10 99991111    Metabolic Disorder Labs: Lab Results  Component Value Date   HGBA1C 4.8 11/19/2021   MPG 91.06 11/19/2021   No results found for: PROLACTIN Lab Results  Component Value Date   CHOL 138 11/19/2021   TRIG 249 (H) 11/19/2021   HDL 43 11/19/2021   CHOLHDL 3.2 11/19/2021   VLDL 50 (H) 11/19/2021   LDLCALC 45 11/19/2021    Physical Findings: AIMS: Facial and Oral Movements Muscles of Facial  Expression: None, normal Lips and Perioral Area: None, normal Jaw: None, normal Tongue: None, normal,Extremity Movements Upper (arms, wrists, hands, fingers): None, normal Lower (legs, knees, ankles, toes): None, normal, Trunk Movements Neck, shoulders, hips: None, normal, Overall Severity Severity of abnormal movements (highest score from questions above): None, normal Incapacitation due to abnormal movements: None, normal Patient's awareness of abnormal movements (rate only patient's  report): No Awareness, Dental Status Current problems with teeth and/or dentures?: No Does patient usually wear dentures?: No  CIWA:  CIWA-Ar Total: 0  Musculoskeletal: Strength & Muscle Tone: within normal limits Gait & Station: normal Patient leans: N/A  Psychiatric Specialty Exam:  Presentation  General Appearance: Bizarre; Casual  Eye Contact:Fair  Speech: Less pressured Speech Volume:Normal  Handedness:Right   Mood and Affect  Mood: Improved mood Affect: Flat  Thought Process  Thought Processes: more logical and congruent Descriptions of Associations: less tangential Orientation: a&o x4 Thought Content: scattered but more logical. Did not appear to be responding to internal stimuli.  History of Schizophrenia/Schizoaffective disorder: Endorses Duration of Psychotic Symptoms: Greater than 6 months Hallucinations: denies  Ideas of Reference:None  Suicidal Thoughts: None reported today  Homicidal Thoughts: Passive homicidal ideation but does not specify plan or intent  Sensorium  Memory:Immediate Fair; Remote Poor; Recent Poor  Judgment:Impaired  Insight: fair, as he is compliant with medication  Executive Functions  Concentration:Fair  Attention Span:Fair  [place] of Knowledge:Poor  Language:Good   Psychomotor Activity  Psychomotor Activity:Normal   Assets  Assets:Resilience   Sleep  Sleep: 6.5 hours    Physical Exam: Physical Exam Vitals  and nursing note reviewed.  Constitutional:      Appearance: Normal appearance. He is normal weight.  HENT:     Head: Normocephalic and atraumatic.  Pulmonary:     Effort: Pulmonary effort is normal.  Neurological:     General: No focal deficit present.     Mental Status: He is oriented to person, place, and time.   Review of Systems  Respiratory:  Negative for shortness of breath.   Cardiovascular:  Negative for chest pain.  Gastrointestinal:  Positive for nausea. Negative for abdominal pain, constipation, diarrhea and heartburn.  Musculoskeletal:  Positive for myalgias.  Neurological:  Negative for headaches.  Blood pressure 104/69, pulse 91, temperature 98.1 F (36.7 C), temperature source Oral, resp. rate 20, height 5' 7.5" (1.715 m), weight 78.1 kg, SpO2 100 %. Body mass index is 26.57 kg/m.   Treatment Plan Summary: Daily contact with patient to assess and evaluate symptoms and progress in treatment and Medication management  PLAN Safety and Monitoring: Involuntary admission to inpatient psychiatric unit for safety, stabilization and treatment Daily contact with patient to assess and evaluate symptoms and progress in treatment Patient's case to be discussed in multi-disciplinary team meeting Observation Level : q15 minute checks Vital signs: q12 hours Precautions: suicide, elopement, and assault     Psychiatric Problems Schizophrenia (r/o bipolar disorder, r/o substance induced psychosis, r/o antisocial personality disorder, r/o MDD with psychosis) Alcohol Use Disorder-Severe -Continue Zyprexa to 5 mg bid (8:00 and 14:00) and 10 mg at night for psychotic symptoms and mood lability -CONTINUE Depakote DR 500 bid for mood lability, VPA level for Monday -Continue Gabapentin 200 mg tid for anxiety and alcohol craving -Agitation protocol: haldol, benadryl, ativan -CIWA monitoring w/o ativan -Will continue to monitor for opiate/alcohol withdrawal -Thiamine/Multivitamin  tablet for nutriitional support   Medical Problems TSH wnl, Lipid panel abnormal for triglycerides 249 and VLDL 50 but does not appear to be fasting, A1c wnl -Patient to be given influenza vaccination, per request.  -COVID swab to be obtained, per request.  Left Shoulder Pain -Left shoulder CXR ordered -Tylenol 1000 mg bid for pain -Ibuprofen 800 tid prn for breakthrough pain   PRNs Maalox/Mylanta 30 mL for indigestion Hydroxyzine 25 mg tid for anxiety Milk of Magnesia 30 mL for constipation Trazodone  50 mg for sleep Zofran for nausea/vomiting   4. Discharge Planning: Social work and case management to assist with discharge planning and identification of hospital follow-up needs prior to discharge Estimated LOS: 5-7 days Discharge Concerns: Need to establish a safety plan; Medication compliance and effectiveness Discharge Goals: Return home with outpatient referrals for mental health follow-up including medication management/psychotherapy  Park Pope, MD 11/23/2021, 11:40 AM

## 2021-11-23 NOTE — BHH Group Notes (Signed)
Adult Psychoeducational Group Not Date:  11/23/2021 Time:  0900-1045 Group Topic/Focus: PROGRESSIVE RELAXATION. A group where deep breathing is taught and tensing and relaxation muscle groups is used. Imagery is used as well.  Pts are asked to imagine 3 pillars that hold them up when they are not able to hold themselves up.  Participation Level:  Pt came into the room and sat, falling asleep, slurred speech,   Additional Comments:  States that he was given medication and is having great difficulty staying awake and asked permission to leave the room, which was given.  Dione Housekeeper

## 2021-11-23 NOTE — Progress Notes (Signed)
Patient ID: Maurice [name], male   DOB: 11/16/1986, 35 y.o.   MRN: [id number] 1:1 observation note: Patient is room resting quietly. He is compliant with his medications. He denies any thoughts of self harm. He attempted to attend relaxation group; however, became drowsy and asked to leave. He also attempted to attend music therapy in which he came in late. Patient is concerned about getting a bus ticket back to [place] upon discharge. No behavioral problems noted.

## 2021-11-23 NOTE — Progress Notes (Signed)
Pt has minimal interaction with peers and staff. Pt verbalized some left shoulder pain. Pt attempted to lay down in bed but was very restless. Pt couldn't lay down and was walking back and forth between dayroom and bedroom. Pt verbalized some diarrhea. He been to the restroom about 5-6 times in a 5 hour time span. Since bedtime, patient has gotten up multiple times to request water to refill pitcher. Pt verbalized suicidal thoughts during med pass, but verbally contracts for safety. Denies HI. Denies AVH.   Pt given PRN pain medication for left shoulder pain. Provider paged and made aware of patient complaints. PRN meds for diarrhea and insomnia ordered and given per order.   Pt verbalized little relief of left shoulder pain from PRN medication.    11/23/21 1955  Psych Admission Type (Psych Patients Only)  Admission Status Involuntary  Psychosocial Assessment  Patient Complaints Self-harm thoughts;Restlessness  Eye Contact Avoids  Facial Expression Sullen  Affect Sullen;Preoccupied  Speech Soft  Interaction Cautious;Needy;Guarded  Motor Activity Restless  Appearance/Hygiene Unremarkable  Behavior Characteristics Restless;Guarded  Mood Sullen;Preoccupied  Thought Process  Coherency Circumstantial  Content WDL  Delusions None reported or observed  Perception WDL  Hallucination None reported or observed  Judgment Poor  Confusion WDL  Danger to Self  Current suicidal ideation? Passive  Self-Injurious Behavior No self-injurious ideation or behavior indicators observed or expressed   Agreement Not to Harm Self Yes  Description of Agreement Verbal Contract  Danger to Others  Danger to Others None reported or observed  Danger to Others Abnormal  Harmful Behavior to others No threats or harm toward other people

## 2021-11-23 NOTE — Progress Notes (Signed)
Patient ID: Maurice [name], male   DOB: February 03, 1986, 35 y.o.   MRN: [id number]  1:1 observation note: Patient in bedroom laying down. He verbalized some left shoulder pain. PRN pain medication given along with bedtime medications. He stated he want to go lay down. He is compliant with his medications and no behavioral problems noted. Remains 1:1 observation for safety and hypersexual behavior.

## 2021-11-23 NOTE — Progress Notes (Signed)
Patient ID: Maurice [name], male   DOB: [birth year], 35 y.o.   MRN: [id number] 1:1 observation note: Patient in day room watching the football game. He appears engaged with his peers. He is compliant with his medications and no behavioral problems noted. Remains 1:1 observation for safety and hypersexual behavior.

## 2021-11-23 NOTE — Progress Notes (Signed)
°   11/23/21 1000  Psych Admission Type (Psych Patients Only)  Admission Status Involuntary  Psychosocial Assessment  Patient Complaints Restlessness  Eye Contact Avertive  Facial Expression Sullen  Affect Preoccupied  Speech Soft  Interaction Cautious;Forwards little;Minimal  Motor Activity Slow  Appearance/Hygiene Unremarkable  Behavior Characteristics Anxious;Guarded  Mood Preoccupied  Thought Process  Coherency Circumstantial  Content WDL  Delusions None reported or observed  Perception WDL  Hallucination None reported or observed  Judgment Poor  Confusion Mild  Danger to Self  Current suicidal ideation? Denies  Danger to Others  Danger to Others None reported or observed  Danger to Others Abnormal  Harmful Behavior to others No threats or harm toward other people

## 2021-11-23 NOTE — Progress Notes (Signed)
Patient ID: Maurice [name], male   DOB: 07-08-1986, 35 y.o.   MRN: [id number] Sent to [facility] for xray of left shoulder. Sent with MHT to complete same.

## 2021-11-23 NOTE — Progress Notes (Signed)
Nursing 1:1 note D:Pt observed lying in bed. Pt eyes closed but moving from side to side. No apparent distress noted. A: 1:1 observation continues for safety  R: Pt remains safe

## 2021-11-23 NOTE — Progress Notes (Signed)
Patient ID: Maurice [name], male   DOB: 1985/12/28, 35 y.o.   MRN: [id number] 1:1 observation note: Patient is compliant with his medications. He continues to complain of left shoulder pain. Per xray, no fracture or abnormality noted. Patient was sent over for xray earlier today. Patient is talkative with staff. His main concern is bus transportation back to [place]. CIWA discontinued as scores have been below 4 for 48 hours. Patient remains on 1:1 observation due safety concerns.

## 2021-11-23 NOTE — Progress Notes (Signed)
Adult Psychoeducational Group Note  Date:  11/23/2021 Time:  8:43 PM  Group Topic/Focus:  Wrap-Up Group:   The focus of this group is to help patients review their daily goal of treatment and discuss progress on daily workbooks.  Participation Level:  Did Not Attend  Participation Quality:   Did Not Attend  Affect:   Did Not Attend  Cognitive:   Did Not Attend  Insight: None  Engagement in Group:   Did Not Attend  Modes of Intervention:   Did Not Attend  Additional Comments:  Pt did not attend evening wrap up group tonight.  Felipa Furnace 11/23/2021, 8:43 PM

## 2021-11-23 NOTE — Progress Notes (Signed)
Patient did not get up for labs this morning. Pt name called and pt would not respond. Labs rescheduled. Pt then gets up after other pts have left for breakfast and asks about labs.

## 2021-11-24 ENCOUNTER — Encounter (HOSPITAL_COMMUNITY): Payer: Self-pay

## 2021-11-24 MED ORDER — OXCARBAZEPINE 300 MG PO TABS
300.0000 mg | ORAL_TABLET | Freq: Two times a day (BID) | ORAL | Status: DC
  Administered 2021-11-26 – 2021-12-02 (×12): 300 mg via ORAL
  Filled 2021-11-24 (×3): qty 1
  Filled 2021-11-24: qty 14
  Filled 2021-11-24: qty 1
  Filled 2021-11-24: qty 14
  Filled 2021-11-24 (×12): qty 1

## 2021-11-24 MED ORDER — DIVALPROEX SODIUM 250 MG PO DR TAB
250.0000 mg | DELAYED_RELEASE_TABLET | Freq: Two times a day (BID) | ORAL | Status: AC
  Administered 2021-11-24 – 2021-11-25 (×3): 250 mg via ORAL
  Filled 2021-11-24 (×6): qty 1

## 2021-11-24 MED ORDER — OXCARBAZEPINE 150 MG PO TABS
150.0000 mg | ORAL_TABLET | Freq: Two times a day (BID) | ORAL | Status: AC
  Administered 2021-11-24 – 2021-11-26 (×4): 150 mg via ORAL
  Filled 2021-11-24 (×4): qty 1

## 2021-11-24 MED ORDER — DIVALPROEX SODIUM 125 MG PO DR TAB
125.0000 mg | DELAYED_RELEASE_TABLET | Freq: Two times a day (BID) | ORAL | Status: AC
  Administered 2021-11-26 (×2): 125 mg via ORAL
  Filled 2021-11-24 (×2): qty 1

## 2021-11-24 NOTE — Progress Notes (Signed)
Patient ID: Maurice [name], male   DOB: 1986-02-01, 34 y.o.   MRN: [id number]   1:1 observation note: Patient in bedroom resting comfortably. Pt is sleeping but remains having periods of interrupted sleep where he would wake up. Pt continues to be on 1:1 observation for safety and hypersexual behavior.

## 2021-11-24 NOTE — BH IP Treatment Plan (Signed)
Interdisciplinary Treatment and Diagnostic Plan Update  11/24/2021 Time of Session: 9:50am Maurice [name] MRN: [id number]  Principal Diagnosis: Schizoaffective disorder, bipolar type [facility])  Secondary Diagnoses: Principal Problem:   Schizoaffective disorder, bipolar type ([place])   Current Medications:  Current Facility-Administered Medications  Medication Dose Route Frequency Provider Last Rate Last Admin   acetaminophen (TYLENOL) tablet 1,000 mg  1,000 mg Oral BID France Ravens, MD   1,000 mg at 11/24/21 0748   alum & mag hydroxide-simeth (MAALOX/MYLANTA) 200-200-20 MG/5ML suspension 30 mL  30 mL Oral Q4H PRN Prescilla Sours, PA-C       haloperidol lactate (HALDOL) injection 5 mg  5 mg Intramuscular Q6H PRN France Ravens, MD       And   LORazepam (ATIVAN) injection 2 mg  2 mg Intramuscular Q6H PRN France Ravens, MD       And   diphenhydrAMINE (BENADRYL) injection 50 mg  50 mg Intramuscular Q6H PRN France Ravens, MD       divalproex (DEPAKOTE) DR tablet 500 mg  500 mg Oral Q12H France Ravens, MD   500 mg at 11/24/21 0748   feeding supplement (BOOST / RESOURCE BREEZE) liquid 1 Container  237 mL Oral BID BM France Ravens, MD   1 Container at 11/24/21 1051   gabapentin (NEURONTIN) capsule 300 mg  300 mg Oral TID Janine Limbo, MD   300 mg at 11/24/21 0748   ibuprofen (ADVIL) tablet 800 mg  800 mg Oral Q8H PRN France Ravens, MD   800 mg at 11/23/21 1953   influenza vac split quadrivalent PF (FLUARIX) injection 0.5 mL  0.5 mL Intramuscular Tomorrow-1000 Nelda Marseille, Amy E, MD       loperamide (IMODIUM) capsule 2 mg  2 mg Oral Q4H PRN Bobbitt, Shalon E, NP   2 mg at 11/23/21 2352   magnesium hydroxide (MILK OF MAGNESIA) suspension 30 mL  30 mL Oral Daily PRN Prescilla Sours, PA-C       multivitamin with minerals tablet 1 tablet  1 tablet Oral Daily Margorie John W, PA-C   1 tablet at 11/24/21 0749   OLANZapine (ZYPREXA) tablet 5 mg  5 mg Oral BID France Ravens, MD   5 mg at 11/24/21 0748   And   OLANZapine (ZYPREXA)  tablet 10 mg  10 mg Oral Aliene Altes, MD   10 mg at 11/23/21 1953   OLANZapine zydis (ZYPREXA) disintegrating tablet 5 mg  5 mg Oral Q8H PRN Massengill, Ovid Curd, MD       ondansetron (ZOFRAN-ODT) disintegrating tablet 8 mg  8 mg Oral Q8H PRN Rosezetta Schlatter, MD       thiamine tablet 100 mg  100 mg Oral Daily Margorie John W, PA-C   100 mg at 11/24/21 0748   traZODone (DESYREL) tablet 50 mg  50 mg Oral QHS PRN Prescilla Sours, PA-C   50 mg at 11/23/21 1953   PTA Medications: Medications Prior to Admission  Medication Sig Dispense Refill Last Dose   ibuprofen (ADVIL) 200 MG tablet Take 400 mg by mouth every 6 (six) hours as needed for headache or moderate pain.       Patient Stressors: Soil scientist issue   Substance abuse   Other: homeless    Patient Strengths: Physical Health  Supportive family/friends   Treatment Modalities: Medication Management, Group therapy, Case management,  1 to 1 session with clinician, Psychoeducation, Recreational therapy.   Physician Treatment Plan for Primary Diagnosis: Schizoaffective disorder, bipolar type ([place])  Long Term Goal(s): Improvement in symptoms so as ready for discharge   Short Term Goals: Ability to identify changes in lifestyle to reduce recurrence of condition will improve Ability to verbalize feelings will improve Ability to disclose and discuss suicidal ideas Ability to demonstrate self-control will improve Ability to identify and develop effective coping behaviors will improve Ability to maintain clinical measurements within normal limits will improve Compliance with prescribed medications will improve Ability to identify triggers associated with substance abuse/mental health issues will improve  Medication Management: Evaluate patient's response, side effects, and tolerance of medication regimen.  Therapeutic Interventions: 1 to 1 sessions, Unit Group sessions and Medication administration.  Evaluation of  Outcomes: Not Met  Physician Treatment Plan for Secondary Diagnosis: Principal Problem:   Schizoaffective disorder, bipolar type ([place])  Long Term Goal(s): Improvement in symptoms so as ready for discharge   Short Term Goals: Ability to identify changes in lifestyle to reduce recurrence of condition will improve Ability to verbalize feelings will improve Ability to disclose and discuss suicidal ideas Ability to demonstrate self-control will improve Ability to identify and develop effective coping behaviors will improve Ability to maintain clinical measurements within normal limits will improve Compliance with prescribed medications will improve Ability to identify triggers associated with substance abuse/mental health issues will improve     Medication Management: Evaluate patient's response, side effects, and tolerance of medication regimen.  Therapeutic Interventions: 1 to 1 sessions, Unit Group sessions and Medication administration.  Evaluation of Outcomes: Not Met   RN Treatment Plan for Primary Diagnosis: Schizoaffective disorder, bipolar type ([place]) Long Term Goal(s): Knowledge of disease and therapeutic regimen to maintain health will improve  Short Term Goals: Ability to remain free from injury will improve, Ability to verbalize frustration and anger appropriately will improve, Ability to demonstrate self-control, Ability to identify and develop effective coping behaviors will improve, and Compliance with prescribed medications will improve  Medication Management: RN will administer medications as ordered by provider, will assess and evaluate patient's response and provide education to patient for prescribed medication. RN will report any adverse and/or side effects to prescribing provider.  Therapeutic Interventions: 1 on 1 counseling sessions, Psychoeducation, Medication administration, Evaluate responses to treatment, Monitor vital signs and CBGs as ordered, Perform/monitor  CIWA, COWS, AIMS and Fall Risk screenings as ordered, Perform wound care treatments as ordered.  Evaluation of Outcomes: Not Met   LCSW Treatment Plan for Primary Diagnosis: Schizoaffective disorder, bipolar type ([place]) Long Term Goal(s): Safe transition to appropriate next level of care at discharge, Engage patient in therapeutic group addressing interpersonal concerns.  Short Term Goals: Engage patient in aftercare planning with referrals and resources, Increase social support, Increase ability to appropriately verbalize feelings, Increase emotional regulation, Identify triggers associated with mental health/substance abuse issues, and Increase skills for wellness and recovery  Therapeutic Interventions: Assess for all discharge needs, 1 to 1 time with Social worker, Explore available resources and support systems, Assess for adequacy in community support network, Educate family and significant other(s) on suicide prevention, Complete Psychosocial Assessment, Interpersonal group therapy.  Evaluation of Outcomes: Not Met   Progress in Treatment: Attending groups: Yes. Participating in groups: Yes. Taking medication as prescribed: Yes. Toleration medication: Yes. Family/Significant other contact made: Yes, individual(s) contacted:  Mother  Patient understands diagnosis: No. Discussing patient identified problems/goals with staff: Yes. Medical problems stabilized or resolved: Yes. Denies suicidal/homicidal ideation: Yes. Issues/concerns per patient self-inventory: No.     New problem(s) identified: No, Describe:  None    New  Short Term/Long Term Goal(s): medication stabilization, elimination of SI thoughts, development of comprehensive mental wellness plan.    Patient Goals: "To get better and to go to groups"    Discharge Plan or Barriers: Patient is currently homeless and is wanting to discharge to the [place] area.   Reason for Continuation of Hospitalization:  Hallucinations Homicidal ideation Medication stabilization Suicidal ideation   Estimated Length of Stay: 3 to 5 days   Scribe for Treatment Team: Vassie Moselle, LCSW 11/24/2021 11:34 AM

## 2021-11-24 NOTE — Group Note (Signed)
Date:  11/24/2021 Time:  9:21 AM  Group Topic/Focus:  Goals Group:   The focus of this group is to help patients establish daily goals to achieve during treatment and discuss how the patient can incorporate goal setting into their daily lives to aide in recovery.    Participation Level:  Minimal  Participation Quality:  Resistant  Affect:  Resistant  Cognitive:  Delusional  Insight: Limited  Engagement in Group:  Limited  Modes of Intervention:  Discussion  Additional Comments:  Pt has a goal of getting back to [place] and speaking to the social worker  Deforest Hoyles Jerrine Urschel 11/24/2021, 9:21 AM

## 2021-11-24 NOTE — Progress Notes (Signed)
Adult Psychoeducational Group Note  Date:  11/24/2021 Time:  9:16 PM  Group Topic/Focus:  Wrap-Up Group:   The focus of this group is to help patients review their daily goal of treatment and discuss progress on daily workbooks.  Participation Level:  Minimal  Participation Quality:  Drowsy  Affect:  Anxious  Cognitive:  Disorganized  Insight: Limited  Engagement in Group:  Lacking and Limited  Modes of Intervention:  Discussion  Additional Comments:  Pt stated his goal for today was to focus on his treatment plan. Pt stated he accomplished his goal today. Pt stated he talked with his doctor and social worker about his care today. Pt rated his overall day a 8 out of 10. Pt stated he was able to contact his sister today which improved his overall day. Pt stated he felt better about himself today. Pt stated he was brought back all meals by staff today. Pt stated he took all medications provided today. Pt stated he attend all groups held today. Pt stated his appetite was pretty good today. Pt rated sleep last night was poor. Pt stated the goal tonight was to get some rest. Pt stated he had some physical pain tonight.Pt stated he had some moderate pain in his left shoulder. Pt rated the moderate pain in his left shoulder a 5 on the pain level scale. Pt nurse was updated on the situation. Pt deny visual hallucinations and auditory issues tonight. Pt denies thoughts of harming himself or others. Pt stated he would alert staff if anything changed.  Felipa Furnace 11/24/2021, 9:16 PM

## 2021-11-24 NOTE — [facility] Counselor (Signed)
CSW spoke with the Pt who states that he called a few shelters with no success.  He states that he cannot go to any shelter or service that is connected with the [employer] because he is banned from that service.  He has not disclosed why he is banned from that service.  The [place] to [place]  CSW stated that she would relay this information to the providers and encouraged the Pt to continue to contact shelters for additional assistance.

## 2021-11-24 NOTE — Progress Notes (Signed)
°   11/24/21 2300  Psych Admission Type (Psych Patients Only)  Admission Status Involuntary  Psychosocial Assessment  Patient Complaints Anxiety  Eye Contact Brief  Facial Expression Anxious;Pensive  Affect Preoccupied  Speech Soft  Interaction Cautious;Forwards little;Guarded;Minimal  Motor Activity Slow  Appearance/Hygiene Unremarkable  Behavior Characteristics Cooperative  Mood Preoccupied  Thought Process  Coherency Circumstantial  Content Blaming others  Delusions None reported or observed  Perception WDL  Hallucination None reported or observed  Judgment Poor  Confusion None  Danger to Self  Current suicidal ideation? Denies  Self-Injurious Behavior No self-injurious ideation or behavior indicators observed or expressed   Danger to Others  Danger to Others None reported or observed  Danger to Others Abnormal  Harmful Behavior to others No threats or harm toward other people

## 2021-11-24 NOTE — Progress Notes (Signed)
Close obs note  Pt was trialed off unit for lunch. Pt poured milk into another pt's tray. Pt didn't attend afternoon group. Pt safe on the unit. Q72m safety checks implemented and continued. Will continue to monitor. Close obs continues for safety.

## 2021-11-24 NOTE — Group Note (Signed)
Recreation Therapy Group Note   Group Topic:Self-Esteem  Group Date: 11/24/2021 Start Time: 1010 End Time: 1050 Facilitators: Caroll Rancher, LRT,CTRS Location: [address] Dayroom   Goal Area(s) Addresses:  Patient will identify and write positive trait about themselves. Patient will successfully identify achievements accomplished. Patient will acknowledge the benefit of healthy self-esteem. Patient will endorse understanding of ways to increase self-esteem.   Group Description:  LRT began group session with open dialogue asking the patients to define self-esteem and verbally identify positive qualities and traits people may possess. Patients were then instructed to design a personalized license plate, with words and drawings, representing at least 3 positive things about themselves. Pts were encouraged to include favorites, things they are proud of, what they enjoy doing, and dreams for their future. If a patient had a life motto or a meaningful phase that expressed their life values, pt's were asked to incorporate that into their design as well. Patients were given the opportunity to share their completed work with the group.   Affect/Mood: N/A   Participation Level: Did not attend    Clinical Observations/Individualized Feedback:     Plan: Continue to engage patient in RT group sessions 2-3x/week.   Caroll Rancher, LRT,CTRS 11/24/2021 2:05 PM

## 2021-11-24 NOTE — Plan of Care (Signed)
°  Problem: Activity: Goal: Will verbalize the importance of balancing activity with adequate rest periods Outcome: Progressing   Problem: Education: Goal: Will be free of psychotic symptoms Outcome: Progressing   

## 2021-11-24 NOTE — Progress Notes (Signed)
Close obs note  Pt's behavior has been unremarkable since last note. Pt has been seen in dayroom. Pt has been appropriate. Pt safe on the unit. Q19m safety checks implemented and continued. Will continue to monitor. Close obs continues for safety.

## 2021-11-24 NOTE — Progress Notes (Signed)
Close obs note  Pt had c/o anxiety and anger. Pt was provided prn medication. Pt expressed that these were effective. Pt continued to be reclusive to their room. Pt was pleasant and showed insight. Pt is pleasant. Pt safe on the unit. Q27m safety checks. Close obs continues for safety. Will continue to monitor.

## 2021-11-24 NOTE — Progress Notes (Signed)
[facility] MD Progress Note  11/24/2021 12:21 PM Maurice [name]  MRN:  [id number] Subjective:    Patient is a 35 year old male with reported psychiatric history of schizophrenia, bipolar disorder, heroin use disorder, cocaine use disorder, alcohol use disorder presents to behavioral health hospital from [facility], ED due to suicidal ideation. On admission, pt was manic and psychotic.   Psychiatry team made following recommendations yesterday: -Continue Zyprexa to 5 mg bid (8:00 and 14:00) and 10 mg at night for psychotic symptoms and mood lability -Continue Depakote DR 500 bid for mood lability, VPA level for Monday -Continue Gabapentin 300 mg tid for anxiety and alcohol craving  On my evaluation today, pt is calmer and less agitated than last week. His speech is less pressured and less tangential. Per staff, pt is less intrusive and less menacing. Per staff, pt does RTIS and have AH. To this Probation officer, pt denies having AH and VH. Maurice [name] does report having delusions and paranoid thoughts.  Maurice [name] reports that his mood is stable, and that his sleep and appetite are without deficit.  Concentration is better.  Pt denies SI and HI. Pt denies having s/e to current psych meds.  Maurice [name] continues to have left shoulder pain. Maurice [name] is agreeable to cross taper of depakote to trileptal due to elevated LFTs in setting of sub therapeutic VA level.   We will change from 1:1 to close obs today after discussion with staff.    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 No pertinent family history. Family Psychiatric  History: See H&P  Social History:  Social History   Substance and Sexual Activity  Alcohol Use Yes   Comment: pt states drinks a gallon of liquor and beer mixed daily     Social History   Substance and Sexual Activity  Drug Use Yes   Types: Cocaine    Social History   Socioeconomic History   Marital status: Single    Spouse name: Not on file   Number of children: Not on file   Years of education: Not on file   Highest education level: Not on file  Occupational History   Not on file  Tobacco Use   Smoking status: Some Days    Types: Cigarettes   Smokeless tobacco: Never  Vaping Use   Vaping Use: Never used  Substance and Sexual Activity   Alcohol use: Yes    Comment: pt states drinks a gallon of liquor and beer mixed daily   Drug use: Yes    Types: Cocaine   Sexual activity: Not Currently  Other Topics Concern   Not on file  Social History Narrative   Not on file   Social Determinants of Health   Financial Resource Strain: Not on file  Food Insecurity: Not on file  Transportation Needs: Not on file  Physical Activity: Not on file  Stress: Not on file  Social Connections: Not on file   Additional Social History:  Sleep: Fair  Appetite:  Fair  Current Medications: Current Facility-Administered Medications  Medication Dose Route Frequency Provider Last Rate Last Admin   acetaminophen (TYLENOL) tablet 1,000 mg  1,000 mg Oral BID France Ravens, MD   1,000 mg at 11/24/21 0748   alum & mag hydroxide-simeth (MAALOX/MYLANTA) 200-200-20 MG/5ML suspension 30 mL  30 mL Oral Q4H PRN Prescilla Sours, PA-C       haloperidol lactate (HALDOL) injection 5 mg  5 mg Intramuscular Q6H PRN France Ravens, MD       And   LORazepam (ATIVAN) injection 2 mg  2 mg Intramuscular Q6H PRN France Ravens, MD       And   diphenhydrAMINE (BENADRYL) injection 50 mg  50 mg Intramuscular Q6H PRN France Ravens, MD       Derrill Memo ON 11/26/2021] divalproex (DEPAKOTE) DR tablet 125 mg  125 mg Oral Q12H Zachari Alberta, Ovid Curd, MD       divalproex (DEPAKOTE) DR tablet 250 mg  250 mg Oral Q12H Suhaib Guzzo, MD       feeding supplement (BOOST / RESOURCE BREEZE) liquid 1 Container  237 mL Oral BID BM France Ravens, MD   1 Container at 11/24/21 1051   gabapentin (NEURONTIN) capsule 300 mg  300 mg Oral TID Janine Limbo, MD   300 mg at 11/24/21 1134   ibuprofen (ADVIL) tablet 800 mg  800 mg Oral Q8H PRN France Ravens, MD   800 mg at 11/23/21 1953   influenza vac split quadrivalent PF (FLUARIX) injection 0.5 mL  0.5 mL Intramuscular Tomorrow-1000 Nelda Marseille, Amy E, MD       loperamide (IMODIUM) capsule 2 mg  2 mg Oral Q4H PRN Bobbitt, Shalon E, NP   2 mg at 11/23/21 2352   magnesium hydroxide (MILK OF MAGNESIA) suspension 30 mL  30 mL Oral Daily PRN Prescilla Sours, PA-C       multivitamin with minerals tablet 1 tablet  1 tablet Oral Daily Prescilla Sours, PA-C   1 tablet at 11/24/21 0749   OLANZapine (ZYPREXA) tablet 5 mg  5 mg Oral BID France Ravens, MD   5 mg at 11/24/21 [id number]   And   OLANZapine (ZYPREXA) tablet 10 mg  10 mg Oral Aliene Altes, MD   10 mg at 11/23/21 1953   OLANZapine zydis (ZYPREXA) disintegrating tablet 5 mg  5 mg Oral Q8H PRN Allisen Pidgeon, Ovid Curd, MD       ondansetron (ZOFRAN-ODT) disintegrating tablet 8 mg  8 mg Oral Q8H PRN Rosezetta Schlatter, MD       OXcarbazepine (TRILEPTAL) tablet 150 mg  150 mg Oral BID Perkins Molina, Ovid Curd, MD       Followed by   Derrill Memo ON 11/26/2021] Oxcarbazepine (TRILEPTAL) tablet 300 mg  300 mg Oral BID Zorina Mallin, MD       thiamine tablet 100 mg  100 mg Oral Daily Margorie John W, PA-C   100 mg at 11/24/21 0748   traZODone (DESYREL) tablet 50 mg  50 mg Oral QHS PRN Prescilla Sours, PA-C   50 mg at 11/23/21 1953    Lab Results:  Results for orders placed or performed during the hospital encounter of 11/18/21 (from the past 48 hour(s))  Comprehensive metabolic  panel     Status: Abnormal   Collection Time: 11/23/21  6:46 PM  Result Value Ref Range   Sodium 139 135 - 145 mmol/L   Potassium 4.2 3.5 - 5.1 mmol/L   Chloride 103 98 - 111  mmol/L   CO2 27 22 - 32 mmol/L   Glucose, Bld 148 (H) 70 - 99 mg/dL    Comment: Glucose reference range applies only to samples taken after fasting for at least 8 hours.   BUN 15 6 - 20 mg/dL   Creatinine, Ser 1.02 0.61 - 1.24 mg/dL   Calcium 9.2 8.9 - 10.3 mg/dL   Total Protein 6.4 (L) 6.5 - 8.1 g/dL   Albumin 3.7 3.5 - 5.0 g/dL   AST 67 (H) 15 - 41 U/L   ALT 74 (H) 0 - 44 U/L   Alkaline Phosphatase 86 38 - 126 U/L   Total Bilirubin 0.8 0.3 - 1.2 mg/dL   GFR, Estimated >60 >60 mL/min    Comment: (NOTE) Calculated using the CKD-EPI Creatinine Equation (2021)    Anion gap 9 5 - 15    Comment: Performed at [facility], [place] [address] [place], [place] [address]  Valproic acid level     Status: Abnormal   Collection Time: 11/23/21  6:46 PM  Result Value Ref Range   Valproic Acid Lvl 36 (L) 50.0 - 100.0 ug/mL    Comment: Performed at [facility], [place] [address], [place], [place] [address]  CBC     Status: None   Collection Time: 11/23/21  6:46 PM  Result Value Ref Range   WBC 4.9 4.0 - 10.5 K/uL   RBC 4.28 4.22 - 5.81 MIL/uL   Hemoglobin 13.3 13.0 - 17.0 g/dL   HCT 40.2 39.0 - 52.0 %   MCV 93.9 80.0 - 100.0 fL   MCH 31.1 26.0 - 34.0 pg   MCHC 33.1 30.0 - 36.0 g/dL   RDW 12.7 11.5 - 15.5 %   Platelets 158 150 - 400 K/uL   nRBC 0.0 0.0 - 0.2 %    Comment: Performed at [facility], [place] [address], [place], [place] [address]  CK     Status: None   Collection Time: 11/23/21  6:46 PM  Result Value Ref Range   Total CK 131 49 - 397 U/L    Comment: Performed at [facility], [place] [address], [place], [place] [address]    Blood Alcohol level:  Lab Results  Component Value Date   ETH <10 99991111    Metabolic Disorder  Labs: Lab Results  Component Value Date   HGBA1C 4.8 11/19/2021   MPG 91.06 11/19/2021   No results found for: PROLACTIN Lab Results  Component Value Date   CHOL 138 11/19/2021   TRIG 249 (H) 11/19/2021   HDL 43 11/19/2021   CHOLHDL 3.2 11/19/2021   VLDL 50 (H) 11/19/2021   LDLCALC 45 11/19/2021    Physical Findings: AIMS: Facial and Oral Movements Muscles of Facial Expression: None, normal Lips and Perioral Area: None, normal Jaw: None, normal Tongue: None, normal,Extremity Movements Upper (arms, wrists, hands, fingers): None, normal Lower (legs, knees, ankles, toes): None, normal, Trunk Movements Neck, shoulders, hips: None, normal, Overall Severity Severity of abnormal movements (highest score from questions above): None, normal Incapacitation due to abnormal movements: None, normal Patient's awareness of abnormal movements (rate only patient's report): No Awareness, Dental Status Current problems with teeth and/or dentures?: No Does patient usually wear dentures?: No  CIWA:  CIWA-Ar Total: 0 COWS:     Musculoskeletal: Strength & Muscle Tone: within normal limits Gait & Station: normal Patient leans: N/A  Psychiatric Specialty Exam:  Presentation  General Appearance: Casual  Eye Contact:Poor  Speech:Normal Rate  Speech Volume:Normal  Handedness:Right   Mood and Affect  Mood:Irritable  Affect:Constricted   Thought Process  Thought Processes:Linear  Descriptions of Associations:Intact  Orientation:Full (Time, Place and Person)  Thought Content:Paranoid Ideation  History of Schizophrenia/Schizoaffective disorder:Yes  Duration of Psychotic Symptoms:Greater than six months  Hallucinations:Hallucinations: -- (pt denies AH and VH, but per staff, pt is having AH throughout the day and is RTIS)  Ideas of Reference:Delusions; Paranoia  Suicidal Thoughts:Suicidal Thoughts: No  Homicidal Thoughts:Homicidal Thoughts: No   Sensorium   Memory:Immediate Fair; Recent Fair; Remote Fair  Judgment:Poor  Insight:Poor   Executive Functions  Concentration:Fair  Attention Span:Fair  [place] of Knowledge:Poor  Language:Good   Psychomotor Activity  Psychomotor Activity:Psychomotor Activity: Normal   Assets  Assets:Resilience   Sleep  Sleep:Sleep: Fair    Physical Exam: Physical Exam Vitals reviewed.  Constitutional:      General: Maurice [name] is not in acute distress.    Appearance: Maurice [name] is normal weight.  Pulmonary:     Effort: Pulmonary effort is normal.  Neurological:     Mental Status: Maurice [name] is alert.     Motor: No weakness.     Gait: Gait normal.   Review of Systems  Constitutional:  Negative for chills and fever.  Cardiovascular:  Negative for chest pain and palpitations.  Musculoskeletal:  Positive for joint pain.  Psychiatric/Behavioral:  Negative for depression, hallucinations, substance abuse and suicidal ideas. The patient is nervous/anxious.   All other systems reviewed and are negative.  Blood pressure 115/70, pulse 99, temperature 98.1 F (36.7 C), temperature source Oral, resp. rate 20, height 5' 7.5" (1.715 m), weight 78.1 kg, SpO2 100 %. Body mass index is 26.57 kg/m.   Treatment Plan Summary: Daily contact with patient to assess and evaluate symptoms and progress in treatment and Medication management   PLAN Safety and Monitoring: Involuntary admission to inpatient psychiatric unit for safety, stabilization and treatment Daily contact with patient to assess and evaluate symptoms and progress in treatment Patient's case to be discussed in multi-disciplinary team meeting Observation Level : q15 minute checks Vital signs: q12 hours Precautions: suicide, elopement, and assault     Psychiatric Problems Schizophrenia (r/o bipolar disorder, r/o substance induced psychosis, r/o antisocial personality disorder, r/o MDD with psychosis) Alcohol Use Disorder-Severe -Continue Zyprexa  to 5 mg bid (8:00 and 14:00) and 10 mg at night for psychotic symptoms and mood lability -Decrease Depakote from DR 500 bid to DR 250 mg bid, for 3 doses (first decreased dose on 12-19 at 2000). Then decrease to DR 125 mg bid for 2 doses, then stop. Pt had elevated LFTs in context of subtherapeutic VA level. Will cross taper to trileptal and pt agreeable to this.  -Start Trileptal 150 mg bid for 4 doses (first dose 11-24-2021 at 2000), then increase to 300 mg bid thereafter - for mood stabilization. This is cross taper from depakote to trileptal.  -Continue Gabapentin 300 mg tid for anxiety and alcohol craving -Agitation protocol: haldol, benadryl, ativan -Trazodone 50 mg for insomnia    Medical Problems TSH wnl, Lipid panel abnormal for triglycerides 249 and VLDL 50 but does not appear to be fasting, A1c wnl -Patient to be given influenza vaccination, per request.  -COVID negative    Left Shoulder Pain -Left shoulder CXR ordered -Tylenol 1000 mg bid for pain -Ibuprofen 800 tid prn for breakthrough pain   PRNs -Maalox/Mylanta 30 mL for indigestion -Hydroxyzine 25 mg tid for anxiety -Milk of Magnesia 30 mL for  constipation -Zofran for nausea/vomiting   4. Discharge Planning: Social work and case management to assist with discharge planning and identification of hospital follow-up needs prior to discharge Estimated LOS: 5-7 days Discharge Concerns: Need to establish a safety plan; Medication compliance and effectiveness Discharge Goals: Return home with outpatient referrals for mental health follow-up including medication management/psychotherapy   Cristy Hilts, MD 11/24/2021, 12:21 PM  Total Time Spent in Direct Patient Care:  I personally spent 30 minutes on the unit in direct patient care. The direct patient care time included face-to-face time with the patient, reviewing the patient's chart, communicating with other professionals, and coordinating care. Greater than 50% of  this time was spent in counseling or coordinating care with the patient regarding goals of hospitalization, psycho-education, and discharge planning needs.   Phineas Inches, MD Psychiatrist

## 2021-11-24 NOTE — Progress Notes (Signed)
Close obs note  Pt found in bed; compliant with medication administration. Pt still has c/o back pain. Pt provided prn medications. Pt has been seen in the dayroom. Pt was viewed responding to internal stimuli. Pt denies this though. Pt denies si/hi/ah/vh and verbally agrees to approach staff if these become apparent or before harming themselves/others while at bhh.  A: Pt provided support and encouragement. Pt given medication per protocol and standing orders. Q49m safety checks implemented and continued. Close obs continues for safety. R: Pt safe on the unit. Will continue to monitor.

## 2021-11-24 NOTE — Group Note (Signed)
LCSW Group Therapy Note ° 1:15pm ° °Type of Therapy and Topic:  Group Therapy:  Identity and Relationships ° °Participation Level:  Did Not Attend ° °Description of Group: °Using the ‘Ungame’ patients were guided to express themselves about a variety of topics. Selected cards for this game included identity and relationships. Patients were able to discuss dealing with positive and negative situations, identifying supports, and other ways to understand your identity. Patients shared unique viewpoints but often had similar characteristics.  Patients encouraged to use this dialogue to develop goals and supports for future progress. °Therapeutic Goals: °Patient will discuss 2 positive and 2 negative situations in their life prior to admission °Patient will identify 2 positive support persons in their home environment °Patient will explore setting goals for themselves and identify supports they need to achieve these goals °Patient will demonstrate empathy for others in the group by responding with positive affirmations of group members shares. ° °Summary of Patient Progress:  Did not attend. ° ° ° °Therapeutic Modalities: °Motivational Interviewing °Cognitive Behavioral Therapy ° ° °Tolulope Pinkett MSW, LCSW °Clincal Social Worker  °[facility]  ° °

## 2021-11-24 NOTE — Progress Notes (Signed)
Patient ID: Maurice [name], male   DOB: 1986/04/30, 35 y.o.   MRN: [id number]   1:1 observation note: Patient in bedroom sitting on the edge of the bed. PT continues to be restless in room. Pt verbalized inability to completely fall asleep because of constant need to use the bathroom. Pt given PRN medication for diarrhea. Remains 1:1 observation for safety and hypersexual behavior.

## 2021-11-24 NOTE — BHH Counselor (Signed)
CSW spoke with the Pt about his discharge plans.  The Pt states that he would like to return to [place] because he has all of his belongings set up under a bridge with 2 other friends who are also homeless.  CSW explained that she cannot provide him with transportation back to a bridge and that this was not a safe and reliable discharge plan.  The Pt states that he will accept information about shelters in [place] but states that he has also been kicked out of several shelters and would prefer to go back to where all of his belongings are. He states that he has also lost his ID and his SSDI card.  He states that he has no form of ID or way to prove who he is.  He also states that he has been banned from the [employer] which helps individuals in [place] get their ID's remade.  The Pt states that he is willing to accept bus tickets to help him get back to [place]. CSW stated that she could provide the Pt with [employer] tickets as well to help the Pt with travel for either at discharge or in the future.  CSW provided the Pt with information about shelters and other housing information for the [place] area.  CSW also provided the Pt with infomration for the [facility] and information about how to get another ID made.  CSW will continue to work with the Pt on discharge planning.

## 2021-11-25 MED ORDER — TRAZODONE HCL 50 MG PO TABS
50.0000 mg | ORAL_TABLET | Freq: Every evening | ORAL | Status: DC | PRN
  Administered 2021-11-25 – 2021-11-26 (×4): 50 mg via ORAL
  Filled 2021-11-25 (×2): qty 1
  Filled 2021-11-25: qty 14
  Filled 2021-11-25: qty 1
  Filled 2021-11-25: qty 14
  Filled 2021-11-25 (×2): qty 1
  Filled 2021-11-25: qty 14
  Filled 2021-11-25: qty 1

## 2021-11-25 NOTE — Plan of Care (Signed)
°  Problem: Health Behavior/Discharge Planning: Goal: Compliance with prescribed medication regimen will improve Outcome: Progressing   Problem: Nutritional: Goal: Ability to achieve adequate nutritional intake will improve Outcome: Progressing   Problem: Role Relationship: Goal: Ability to communicate needs accurately will improve Outcome: Progressing   

## 2021-11-25 NOTE — Group Note (Signed)
Recreation Therapy Group Note   Group Topic:Team Building  Group Date: 11/25/2021 Start Time: 0950 End Time: 1040 Facilitators: Caroll Rancher, LRT,CTRS Location: [address] Dayroom   Goal Area(s) Addresses:  Patient will effectively work with peer towards shared goal.  Patient will identify skills used to make activity successful.  Patient will identify how skills used during activity can be applied to reach post d/c goals.   Group Description: [employer]. In teams of 5-6, patients were given 15 craft pipe cleaners. Using the materials provided, patients were instructed to compete again the opposing team(s) to build the tallest free-standing structure from floor level. The activity was timed; difficulty increased by Clinical research associate as Production designer, theatre/television/film continued.  Systematically resources were removed with additional directions for example, placing one arm behind their back, working in silence, and shape stipulations. LRT facilitated post-activity discussion reviewing team processes and necessary communication skills involved in completion. Patients were encouraged to reflect how the skills utilized, or not utilized, in this activity can be incorporated to positively impact support systems post discharge.   Affect/Mood: N/A   Participation Level: Did not attend    Clinical Observations/Individualized Feedback:     Plan: Continue to engage patient in RT group sessions 2-3x/week.   Caroll Rancher, LRT,CTRS 11/25/2021 1:00 PM

## 2021-11-25 NOTE — Progress Notes (Signed)
Progress note  Pt found in bed; compliant with medication administration. Pt denies all for this Clinical research associate. Pt has been reclusive to their room and resting most of the day. Pt is minimal with assessment but pleasant. Pt provided meal trays and snacks. Pt denies si/hi/ah/vh and verbally agrees to approach staff if these become apparent or before harming themselves/others while at bhh.  A: Pt provided support and encouragement. Pt given medication per protocol and standing orders. Q39m safety checks implemented and continued. Close obs continues for safety.  R: Pt safe on the unit. Will continue to monitor.
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
## 2021-11-25 NOTE — Progress Notes (Signed)
Psychoeducational Group Note  Date:  11/25/2021 Time:  2028  Group Topic/Focus:  Wrap-Up Group:   The focus of this group is to help patients review their daily goal of treatment and discuss progress on daily workbooks.  Participation Level: Did Not Attend  Participation Quality:  Not Applicable  Affect:  Not Applicable  Cognitive:  Not Applicable  Insight:  Not Applicable  Engagement in Group: Not Applicable  Additional Comments:  The patient did not attend group this evening.   Tyden Kann S 11/25/2021, 8:30 PM

## 2021-11-25 NOTE — Progress Notes (Signed)
°   11/25/21 0513  Pain Assessment  Pain Scale 0-10  Pain Score 8  Pain Type Acute pain  Pain Location Shoulder  Pain Orientation Left  Pain Intervention(s) Medication (See eMAR)  Sleep  Number of Hours 6.75

## 2021-11-25 NOTE — Progress Notes (Signed)
°   11/25/21 2300  Psych Admission Type (Psych Patients Only)  Admission Status Involuntary  Psychosocial Assessment  Patient Complaints None  Eye Contact Brief  Facial Expression Anxious;Pensive  Affect Anxious;Preoccupied  Speech Soft  Interaction Cautious;Forwards little;Guarded;Minimal  Motor Activity Slow  Appearance/Hygiene Unremarkable  Behavior Characteristics Cooperative  Mood Preoccupied  Thought Process  Coherency Circumstantial  Content Blaming others  Delusions None reported or observed  Perception WDL  Hallucination None reported or observed  Judgment Poor  Confusion None  Danger to Self  Current suicidal ideation? Denies  Self-Injurious Behavior No self-injurious ideation or behavior indicators observed or expressed   Danger to Others  Danger to Others None reported or observed  Danger to Others Abnormal  Harmful Behavior to others No threats or harm toward other people

## 2021-11-26 MED ORDER — OLANZAPINE 2.5 MG PO TABS
2.5000 mg | ORAL_TABLET | Freq: Two times a day (BID) | ORAL | Status: DC
  Administered 2021-11-26 – 2021-12-02 (×12): 2.5 mg via ORAL
  Filled 2021-11-26 (×2): qty 1
  Filled 2021-11-26: qty 14
  Filled 2021-11-26 (×6): qty 1
  Filled 2021-11-26: qty 14
  Filled 2021-11-26 (×3): qty 1
  Filled 2021-11-26 (×2): qty 14
  Filled 2021-11-26 (×4): qty 1
  Filled 2021-11-26: qty 14
  Filled 2021-11-26: qty 1

## 2021-11-26 MED ORDER — OLANZAPINE 10 MG PO TABS
10.0000 mg | ORAL_TABLET | Freq: Every day | ORAL | Status: DC
  Administered 2021-11-26 – 2021-12-01 (×6): 10 mg via ORAL
  Filled 2021-11-26: qty 1
  Filled 2021-11-26 (×2): qty 7
  Filled 2021-11-26 (×6): qty 1
  Filled 2021-11-26: qty 7

## 2021-11-26 NOTE — Group Note (Signed)
Recreation Therapy Group Note   Group Topic:Coping Skills  Group Date: 11/26/2021 Start Time: 1003 End Time: 1037 Facilitators: Caroll Rancher, LRT,CTRS Location: [address] Dayroom   Goal Area(s) Addresses:  Patients will identify positive coping skills. Patients will identify benefits of using coping skills post d/c.  Group Description:  Mindmap.  Patients were given an outline of a mind map.  LRT and patients filled in the first 8 boxes with situations in which coping skills would be needed (communication, work, family, anxiety, depression, substance abuse, school and support systems).  Patients were then given 15 minutes to come up with at least three coping skills for each situation identified.  After which, the group would come back together and LRT would write the coping skills on the board.  Patients could then fill in any blank spaces on their sheets.   Affect/Mood: N/A   Participation Level: Did not attend    Clinical Observations/Individualized Feedback:     Plan: Continue to engage patient in RT group sessions 2-3x/week.   Caroll Rancher, Antonietta Jewel 11/26/2021 12:28 PM

## 2021-11-26 NOTE — Progress Notes (Signed)
[facility] Post 1:1 Observation Documentation  For the first (8) hours following discontinuation of 1:1 precautions, a progress note entry by nursing staff should be documented at least every 2 hours, reflecting the patient's behavior, condition, mood, and conversation.  Use the progress notes for additional entries.  Time 1:1 discontinued:  1400  Patient's Behavior:  Patient is calm and appropriate.  Patient's Condition:  Patient is his room resting.  Patient's Conversation:  No interaction with staff or peers.  Audrie Lia Aeriana Speece 11/26/2021, 7:01 PM

## 2021-11-26 NOTE — Progress Notes (Signed)
[facility] Post 1:1 Observation Documentation  For the first (8) hours following discontinuation of 1:1 precautions, a progress note entry by nursing staff should be documented at least every 2 hours, reflecting the patient's behavior, condition, mood, and conversation.  Use the progress notes for additional entries.  Time 1:1 discontinued:  1400  Patient's Behavior:  patient is calm and appropriate.  Patient's Condition:  Patient in his room resting.  Patient's Conversation:  No interaction with staff or peers.  Audrie Lia Nyelle Wolfson 11/26/2021, 7:03 PM

## 2021-11-26 NOTE — Group Note (Signed)
[facility] LCSW Group Therapy   Type of Therapy and Topic:  Group Therapy:  Strengths Exploration   Participation Level: Minimal  Description of Group: This group allows individuals to explore their strengths, learn to use strengths in new ways to improve well-being. Strengths-based interventions involve identifying strengths, understanding how they are used, and learning new ways to apply them. Individuals will identify their strengths, and then explore their roles in different areas of life (relationships, professional life, and personal fulfillment). Individuals will think about ways in which they currently use their strengths, along with new ways they could begin using them.    Therapeutic Goals Patient will verbalize two of their strengths Patient will identify how their strengths are currently used Patient will identify two new ways to apply their strengths  Patients will create a plan to apply their strengths in their daily lives     Summary of Patient Progress:  Pt attended group, however offered minimal participation and was often off topic. Pt shared that caring and responsibility are his strengths. Pt fell asleep during latter half of group.        Therapeutic Modalities Cognitive Behavioral Therapy Motivational Interviewing

## 2021-11-26 NOTE — [facility] Counselor (Signed)
CSW spoke with the Pt (along with the doctor and the nurse practitioner) who states that at discharge he would like to go to the emergency shelter on [facility] in [place] ([facility] [employer], CSW found this information online).  He states that he will need to be there before 4:00pm to get in line for an available bed.  The Pt states that he would like to have [employer] tickets as well.  CSW will provide the Pt with these bus tickets and will fill out a transportation waiver at discharge for the Pt to be taken back to the [place] area.

## 2021-11-26 NOTE — Progress Notes (Signed)
[facility] MD Progress Note  11/26/2021 12:20 PM Maurice [name]  MRN:  [id number] Subjective:    Patient is a 35 year old male with reported psychiatric history of schizophrenia, bipolar disorder, heroin use disorder, cocaine use disorder, alcohol use disorder presents to behavioral health hospital from [facility], ED due to suicidal ideation. On admission, pt was manic and psychotic.   Psychiatry team made following recommendations yesterday: -Continue Zyprexa 5 mg bid (8:00 and 14:00) and 10 mg at night for psychotic symptoms and mood lability. We discussed adding haldol, risperdal, or geodon today, as antipsychotic, with plan to decrease the more sedating zyrexa, as pt is sedated throughout the day, but patient declines this and would like to continue with zyprexa, as it is currently ordered. Will continue to monitor for sedation. Pt continues to have psychotic symptoms, that are improved since admission.  -Cross taper from Depakote to Trileptal: Decrease Depakote from DR 500 bid to DR 250 mg bid, for 3 doses (first decreased dose on 12-19 at 2000). Then decrease to DR 125 mg bid for 2 doses, then stop. Pt had elevated LFTs in context of subtherapeutic VA level. Will cross taper to trileptal and pt agreeable to this.  2.   Start Trileptal 150 mg bid for 4 doses (first dose 11-24-2021 at 2000), then increase to 300 mg bid thereafter - for mood stabilization. This is cross taper from depakote to trileptal.  -Continue Gabapentin 300 mg tid for anxiety and alcohol craving -Agitation protocol: haldol, benadryl, ativan -Continue Trazodone 50 mg for insomnia   On my evaluation today, pt is interviewed with NP and SW. Pt is sleeping upon arrival to room around 200pm. Per sitter, they report pt sleeping a lot, on and off during the day. They report RTIS is much less compared to Monday (2 days ago) and they report NO instances of acting out or aggressive behavior within the last 24 hours.  Pts insight and judgement  appear to be improving.  He is less fixated on previous delusional thoughts of being the protector of others and the streets.  Pt denies having AH or VH. Pt reports that mood is euthymic and not depressed. Sleep is more than adequate, napping during the day, and appetite is without deficit.  Pt denies SI and HI.  Pt reports feeling sedated, and denies having other s/e to current psych meds.  He continues to have left shoulder pain.   Pt is agreeable with adjusting medication doses, due to sedation, and augmenting with other less sedating medications, if necessary.  Pt continues to be agreeable to continue cross taper of depakote to trileptal due to elevated LFTs in setting of sub therapeutic VA level.    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 No pertinent family history. Family Psychiatric  History: See H&P  Social History:  Social History   Substance and Sexual Activity  Alcohol Use Yes   Comment: pt states drinks a gallon of liquor and beer mixed daily     Social History   Substance  and Sexual Activity  Drug Use Yes   Types: Cocaine    Social History   Socioeconomic History   Marital status: Single    Spouse name: Not on file   Number of children: Not on file   Years of education: Not on file   Highest education level: Not on file  Occupational History   Not on file  Tobacco Use   Smoking status: Some Days    Types: Cigarettes   Smokeless tobacco: Never  Vaping Use   Vaping Use: Never  used  Substance and Sexual Activity   Alcohol use: Yes    Comment: pt states drinks a gallon of liquor and beer mixed daily   Drug use: Yes    Types: Cocaine   Sexual activity: Not Currently  Other Topics Concern   Not on file  Social History Narrative   Not on file   Social Determinants of Health   Financial Resource Strain: Not on file  Food Insecurity: Not on file  Transportation Needs: Not on file  Physical Activity: Not on file  Stress: Not on file  Social Connections: Not on file   Additional Social History:                         Sleep: Fair  Appetite:  Fair  Current Medications: Current Facility-Administered Medications  Medication Dose Route Frequency Provider Last Rate Last Admin   acetaminophen (TYLENOL) tablet 1,000 mg  1,000 mg Oral BID Park Pope, MD   1,000 mg at 11/26/21 0936   alum & mag hydroxide-simeth (MAALOX/MYLANTA) 200-200-20 MG/5ML suspension 30 mL  30 mL Oral Q4H PRN Melbourne Abts W, PA-C       haloperidol lactate (HALDOL) injection 5 mg  5 mg Intramuscular Q6H PRN Park Pope, MD       And   LORazepam (ATIVAN) injection 2 mg  2 mg Intramuscular Q6H PRN Park Pope, MD       And   diphenhydrAMINE (BENADRYL) injection 50 mg  50 mg Intramuscular Q6H PRN Park Pope, MD       divalproex (DEPAKOTE) DR tablet 125 mg  125 mg Oral Q12H Carman Essick, Harrold Donath, MD   125 mg at 11/26/21 0936   feeding supplement (BOOST / RESOURCE BREEZE) liquid 1 Container  237 mL Oral BID BM Park Pope, MD   1 Container at 11/26/21 [phone]   gabapentin (NEURONTIN) capsule 300 mg  300 mg Oral TID Phineas Inches, MD   300 mg at 11/26/21 6269   ibuprofen (ADVIL) tablet 800 mg  800 mg Oral Q8H PRN Park Pope, MD   800 mg at 11/25/21 1416   influenza vac split quadrivalent PF (FLUARIX) injection 0.5 mL  0.5 mL Intramuscular Tomorrow-1000 Mason Jim, Amy E, MD       loperamide (IMODIUM) capsule 2 mg  2 mg Oral Q4H PRN Bobbitt, Shalon E, NP   2 mg at 11/26/21 0946   magnesium  hydroxide (MILK OF MAGNESIA) suspension 30 mL  30 mL Oral Daily PRN Melbourne Abts W, PA-C       multivitamin with minerals tablet 1 tablet  1 tablet Oral Daily Melbourne Abts W, PA-C   1 tablet at 11/26/21 0937   OLANZapine (ZYPREXA) tablet 2.5 mg  2.5 mg Oral BID Caylee Vlachos, Harrold Donath, MD       And   OLANZapine (ZYPREXA) tablet 10 mg  10 mg Oral QHS Eleanore Junio, Harrold Donath, MD       OLANZapine zydis (ZYPREXA)  disintegrating tablet 5 mg  5 mg Oral Q8H PRN Analeese Andreatta, Ovid Curd, MD   5 mg at 11/24/21 1358   ondansetron (ZOFRAN-ODT) disintegrating tablet 8 mg  8 mg Oral Q8H PRN Rosezetta Schlatter, MD       Oxcarbazepine (TRILEPTAL) tablet 300 mg  300 mg Oral BID Neoma Uhrich, MD       thiamine tablet 100 mg  100 mg Oral Daily Margorie John W, PA-C   100 mg at 11/26/21 [id number]   traZODone (DESYREL) tablet 50 mg  50 mg Oral QHS,MR X 1 Cheryl Stabenow, MD   50 mg at 11/25/21 2342    Lab Results:  No results found for this or any previous visit (from the past 48 hour(s)).   Blood Alcohol level:  Lab Results  Component Value Date   ETH <10 [id number]    Metabolic Disorder Labs: Lab Results  Component Value Date   HGBA1C 4.8 11/19/2021   MPG 91.06 11/19/2021   No results found for: PROLACTIN Lab Results  Component Value Date   CHOL 138 11/19/2021   TRIG 249 (H) 11/19/2021   HDL 43 11/19/2021   CHOLHDL 3.2 11/19/2021   VLDL 50 (H) 11/19/2021   LDLCALC 45 11/19/2021    Physical Findings: AIMS: Facial and Oral Movements Muscles of Facial Expression: None, normal Lips and Perioral Area: None, normal Jaw: None, normal Tongue: None, normal,Extremity Movements Upper (arms, wrists, hands, fingers): None, normal Lower (legs, knees, ankles, toes): None, normal, Trunk Movements Neck, shoulders, hips: None, normal, Overall Severity Severity of abnormal movements (highest score from questions above): None, normal Incapacitation due to abnormal movements: None, normal Patient's awareness of abnormal  movements (rate only patient's report): No Awareness, Dental Status Current problems with teeth and/or dentures?: No Does patient usually wear dentures?: No  CIWA:  CIWA-Ar Total: 0 COWS:     Musculoskeletal: Strength & Muscle Tone: within normal limits Gait & Station: normal Patient leans: N/A  Psychiatric Specialty Exam:  Presentation  General Appearance: Casual  Eye Contact:Poor  Speech:Normal Rate  Speech Volume:Normal  Handedness:Right   Mood and Affect  Mood: calm during interview today   Affect:Constricted   Thought Process  Thought Processes:Linear  Descriptions of Associations:Intact  Orientation:Full (Time, Place and Person)  Thought Content: Denies having Paranoid Ideation today   History of Schizophrenia/Schizoaffective disorder:Yes  Duration of Psychotic Symptoms:Greater than six months  Hallucinations:denies Ideas of Reference: denies Delusions; denies Paranoia  Suicidal Thoughts:denies  Homicidal Thoughts:denies    Sensorium  Memory:Immediate Fair; Recent Fair; Remote [place]    Executive Functions  Concentration:Fair  Attention Span:Fair  [place] of Knowledge:Poor  Language:Good   Psychomotor Activity  Psychomotor Activity:No data recorded   Assets  Assets:Resilience   Sleep  Sleep:No data recorded    Physical Exam: Physical Exam Vitals reviewed.  Constitutional:      General: He is not in acute distress.    Appearance: He is normal weight.  Pulmonary:     Effort: Pulmonary effort is normal.  Neurological:     Mental Status: He is alert.     Motor: No weakness.     Gait: Gait normal.   Review of Systems  Constitutional:  Negative for chills and fever.  Cardiovascular:  Negative for chest pain and palpitations.  Musculoskeletal:  Positive for joint pain.  Psychiatric/Behavioral:  Negative for depression, hallucinations, substance abuse and suicidal ideas.  The patient is nervous/anxious. The patient does not have insomnia.   All other systems  reviewed and are negative.  Blood pressure 101/68, pulse (!) 101, temperature 98.1 F (36.7 C), temperature source Oral, resp. rate 16, height 5' 7.5" (1.715 m), weight 78.1 kg, SpO2 99 %. Body mass index is 26.57 kg/m.   Treatment Plan Summary: Daily contact with patient to assess and evaluate symptoms and progress in treatment and Medication management   PLAN Safety and Monitoring: Involuntary admission to inpatient psychiatric unit for safety, stabilization and treatment Daily contact with patient to assess and evaluate symptoms and progress in treatment Patient's case to be discussed in multi-disciplinary team meeting Observation Level : q15 minute checks Vital signs: q12 hours Precautions: suicide, elopement, and assault     Psychiatric Problems Schizophrenia (r/o bipolar disorder, r/o substance induced psychosis, r/o antisocial personality disorder, r/o MDD with psychosis) Alcohol Use Disorder-Severe -DECREASE Zyprexa to 2.5 mg bid (8:00 and 14:00) and 10 mg at night for psychotic symptoms and mood lability. Due to excessive daytime sedation. Will continue to monitor for sedation. Pt continues to have psychotic symptoms, that are improved since admission.  -Cross taper from Depakote to Trileptal: Decrease Depakote from DR 250 mg bid to DR 125 mg bid for 2 doses, then stop. Pt had elevated LFTs in context of subtherapeutic VA level. Will cross taper to trileptal and pt agreeable to this.  2.   Continue trileptal 300 mg bid - for mood stabilization. This is cross taper from depakote to trileptal.  -Continue Gabapentin 300 mg tid for anxiety and alcohol craving -Agitation protocol: haldol, benadryl, ativan -Continue Trazodone 50 mg for insomnia    Medical Problems TSH wnl, Lipid panel abnormal for triglycerides 249 and VLDL 50 but does not appear to be fasting, A1c wnl -Patient to be given  influenza vaccination, per request.  -COVID negative    Left Shoulder Pain -Left shoulder CXR: There is no evidence of fracture or dislocation. There is no evidence of arthropathy or other focal bone abnormality. Soft tissues are unremarkable. -Tylenol 1000 mg bid for pain -Ibuprofen 800 tid prn for breakthrough pain   PRNs -Maalox/Mylanta 30 mL for indigestion -Hydroxyzine 25 mg tid for anxiety -Milk of Magnesia 30 mL for constipation -Zofran for nausea/vomiting   4. Discharge Planning: Social work and case management to assist with discharge planning and identification of hospital follow-up needs prior to discharge Estimated LOS: 5-7 days Discharge Concerns: Need to establish a safety plan; Medication compliance and effectiveness Discharge Goals: Return home with outpatient referrals for mental health follow-up including medication management/psychotherapy   Christoper Allegra, MD 11/26/2021, 12:20 PM  Total Time Spent in Direct Patient Care:  I personally spent 30 minutes on the unit in direct patient care. The direct patient care time included face-to-face time with the patient, reviewing the patient's chart, communicating with other professionals, and coordinating care. Greater than 50% of this time was spent in counseling or coordinating care with the patient regarding goals of hospitalization, psycho-education, and discharge planning needs.   Janine Limbo, MD Psychiatrist

## 2021-11-26 NOTE — [facility] Group Notes (Signed)
Adult Psychoeducational Group Note  Date:  11/26/2021 Time:  9:24 AM  Group Topic/Focus:  Goals Group:   The focus of this group is to help patients establish daily goals to achieve during treatment and discuss how the patient can incorporate goal setting into their daily lives to aide in recovery.  Participation Level:  Active  Participation Quality:  Appropriate  Affect:  Appropriate  Cognitive:  Appropriate  Insight: Appropriate  Engagement in Group:  Improving  Modes of Intervention:  Discussion  Additional Comments:  Pt's goal is to work on getting to [place], making calls to different places      Donell Beers 11/26/2021, 9:24 AM

## 2021-11-26 NOTE — Progress Notes (Signed)
[facility] Post 1:1 Observation Documentation  For the first (8) hours following discontinuation of 1:1 precautions, a progress note entry by nursing staff should be documented at least every 2 hours, reflecting the patient's behavior, condition, mood, and conversation.  Use the progress notes for additional entries.  Time 1:1 discontinued:  1400  Patient's Behavior:  Patient is calm and appropriate on the unit.  Patient's Condition:  Patient in bed resting.    Patient's Conversation:  No interaction with staff or peers.  Maurice [name] 11/26/2021, 2:30 PM

## 2021-11-26 NOTE — Progress Notes (Signed)
Closed observation: Patient maintained on closed observation for safety.  Medications given as prescribed.  Patient is calm and appropriate on the unit.  Patient visible in milieu.  Attended group and participated.  Patient is safe on the unit.

## 2021-11-26 NOTE — [facility] Group Notes (Signed)
The focus of this group is to help patients review their daily goal of treatment and discuss progress on daily workbooks. Pt was not present for tonight's wrap up group.  

## 2021-11-26 NOTE — Progress Notes (Signed)
Pt has been sleep majority of the evening    11/26/21 2200  Psych Admission Type (Psych Patients Only)  Admission Status Involuntary  Psychosocial Assessment  Patient Complaints None  Eye Contact Brief  Facial Expression Anxious;Pensive  Affect Anxious;Preoccupied  Speech Soft  Interaction Cautious;Forwards little;Guarded;Minimal  Motor Activity Slow  Appearance/Hygiene Unremarkable  Behavior Characteristics Cooperative  Mood Preoccupied  Thought Process  Coherency Circumstantial  Content Blaming others  Delusions None reported or observed  Perception WDL  Hallucination None reported or observed  Judgment Poor  Confusion None  Danger to Self  Current suicidal ideation? Denies  Self-Injurious Behavior No self-injurious ideation or behavior indicators observed or expressed   Danger to Others  Danger to Others None reported or observed  Danger to Others Abnormal  Harmful Behavior to others No threats or harm toward other people

## 2021-11-27 MED ORDER — TRAZODONE HCL 50 MG PO TABS
50.0000 mg | ORAL_TABLET | Freq: Every evening | ORAL | Status: DC | PRN
  Administered 2021-11-27 – 2021-12-01 (×4): 50 mg via ORAL
  Filled 2021-11-27 (×4): qty 1
  Filled 2021-11-27: qty 7

## 2021-11-27 NOTE — Group Note (Signed)
Recreation Therapy Group Note   Group Topic:Other  Group Date: 11/27/2021 Start Time: 1000 End Time: 1050 Facilitators: Caroll Rancher, LRT,CTRS Location: [address] Dayroom   Goal Area(s) Addresses:  Patient will participate in the creative process to complete all crafts.  Patient will interact pro-socially with staff and peers. Patient will share traditions, activities, and positive feelings produced during the holidays.  Patient will follow directions on the 1st prompt.  Group Description: LRT facilitated a therapeutic art activity to encourage self-expression and creativity in recognition of the approaching holidays. Writer explained to patients how to create wreaths using paper plates.  Patients were also provided Christmas lights that could be used to decorate the wreaths. Print outs of wreaths were also provided if patients simply wanted to color them and glue them to the plates.   Patients were encouraged to engage in leisure discussions about festive activities and hobbies they like to participate in during the winter season.   Affect/Mood: Appropriate   Participation Level: Engaged   Participation Quality: Independent   Behavior: Appropriate   Speech/Thought Process: Delusional   Insight: Fair   Judgement: Fair    Modes of Intervention: Art   Patient Response to Interventions:  Engaged   Education Outcome:  Acknowledges education and In group clarification offered    Clinical Observations/Individualized Feedback: Pt worked well during group, was focused on activity and was social with peers.  Pt was responding, having a conversation with someone not there but was bright during group.    Plan: Continue to engage patient in RT group sessions 2-3x/week.   Caroll Rancher, Antonietta Jewel  11/27/2021 12:45 PM

## 2021-11-27 NOTE — Progress Notes (Signed)
The patient rated his day as a 10 out of 10 since he hung out with a close peer. He also played football in the gym. His goal for tomorrow is to work on his discharge plans. He went off on a tangent at times.

## 2021-11-27 NOTE — Progress Notes (Signed)
°   11/27/21 2100  Psych Admission Type (Psych Patients Only)  Admission Status Involuntary  Psychosocial Assessment  Patient Complaints Anxiety  Eye Contact Brief  Facial Expression Anxious;Pensive  Affect Anxious;Preoccupied  Speech Soft  Interaction Cautious;Forwards little;Guarded;Minimal  Motor Activity Slow  Appearance/Hygiene Unremarkable  Behavior Characteristics Cooperative;Anxious  Mood Pleasant;Anxious  Thought Process  Coherency Circumstantial  Content Blaming others  Delusions None reported or observed  Perception WDL  Hallucination None reported or observed  Judgment Poor  Confusion None  Danger to Self  Current suicidal ideation? Denies  Self-Injurious Behavior No self-injurious ideation or behavior indicators observed or expressed   Danger to Others  Danger to Others None reported or observed  Danger to Others Abnormal  Harmful Behavior to others No threats or harm toward other people

## 2021-11-27 NOTE — Progress Notes (Signed)
Patient did not attend morning goals group.  

## 2021-11-27 NOTE — Progress Notes (Signed)
[facility] MD Progress Note  11/27/2021 11:58 AM Maurice [name]  MRN:  [id number] Subjective:    Patient is a 35 year old male with reported psychiatric history of schizophrenia, bipolar disorder, heroin use disorder, cocaine use disorder, alcohol use disorder presents to behavioral health hospital from [facility], ED due to suicidal ideation. On admission, pt was manic and psychotic.   Psychiatry team made following recommendations yesterday: -DECREASE Zyprexa to 2.5 mg bid (8:00 and 14:00) and 10 mg at night for psychotic symptoms and mood lability. Due to excessive daytime sedation. Will continue to monitor for sedation. Pt continues to have psychotic symptoms, that are improved since admission.  -Cross taper from Depakote to Trileptal: Decrease Depakote from DR 250 mg bid to DR 125 mg bid for 2 doses, then stop. Pt had elevated LFTs in context of subtherapeutic VA level. Will cross taper to trileptal and pt agreeable to this.  2.   Continue trileptal 300 mg bid - for mood stabilization. This is cross taper from depakote to trileptal.  -Continue Gabapentin 300 mg tid for anxiety and alcohol craving -Agitation protocol: haldol, benadryl, ativan -Continue Trazodone 50 mg for insomnia   On my evaluation today, pt is more interactive and less sedated.  Per nursing, pt is improving and participating more in groups. No behavioral or aggression issues since close obs was d/c.  Pts insight and judgement appear to be improving.  Denies having paranoid thoughts. There is no evidence of delusional thought construct.  Pt denies having AH or VH. Pt reports that mood is euthymic and not depressed. Sleep is adequate, and feeling less tired during the day.  Pt denies SI and HI.  He denies having other s/e to current psych meds.  He continues to have left shoulder pain that is being addressed with non controlled medications.   Pt agreeable to labs in the morning.  We discussed discharge planning, and pt does  not have concerns about this.  Pt requests meds at discharge and also Rx sent to [facility] [address] in [place].    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 No pertinent family history. Family Psychiatric  History: See H&P  Social History:  Social History   Substance and Sexual Activity  Alcohol Use Yes   Comment: pt states drinks a gallon of liquor and beer mixed daily     Social History   Substance and Sexual Activity  Drug Use Yes   Types: Cocaine    Social History   Socioeconomic History   Marital status: Single    Spouse name: Not on file   Number of children: Not on file   Years of education: Not on file   Highest education level: Not on file  Occupational History   Not on file  Tobacco Use   Smoking status: Some Days    Types: Cigarettes   Smokeless tobacco: Never  Vaping Use   Vaping Use: Never used  Substance and Sexual Activity   Alcohol use: Yes    Comment: pt states  drinks a gallon of liquor and beer mixed daily   Drug use: Yes    Types: Cocaine   Sexual activity: Not Currently  Other Topics Concern   Not on file  Social History Narrative   Not on file   Social Determinants of Health   Financial Resource Strain: Not on file  Food Insecurity: Not on file  Transportation Needs: Not on file  Physical Activity: Not on file  Stress: Not on file  Social Connections: Not on file   Additional Social History:                         Sleep: Fair  Appetite:   Fair  Current Medications: Current Facility-Administered Medications  Medication Dose Route Frequency Provider Last Rate Last Admin   acetaminophen (TYLENOL) tablet 1,000 mg  1,000 mg Oral BID France Ravens, MD   1,000 mg at 11/27/21 0817   alum & mag hydroxide-simeth (MAALOX/MYLANTA) 200-200-20 MG/5ML suspension 30 mL  30 mL Oral Q4H PRN Margorie John W, PA-C       haloperidol lactate (HALDOL) injection 5 mg  5 mg Intramuscular Q6H PRN France Ravens, MD       And   LORazepam (ATIVAN) injection 2 mg  2 mg Intramuscular Q6H PRN France Ravens, MD       And   diphenhydrAMINE (BENADRYL) injection 50 mg  50 mg Intramuscular Q6H PRN France Ravens, MD       feeding supplement (BOOST / RESOURCE BREEZE) liquid 1 Container  237 mL Oral BID BM France Ravens, MD   237 mL at 11/27/21 1111   gabapentin (NEURONTIN) capsule 300 mg  300 mg Oral TID Janine Limbo, MD   300 mg at 11/27/21 1110   ibuprofen (ADVIL) tablet 800 mg  800 mg Oral Q8H PRN France Ravens, MD   800 mg at 11/27/21 1110   influenza vac split quadrivalent PF (FLUARIX) injection 0.5 mL  0.5 mL Intramuscular Tomorrow-1000 Nelda Marseille, Amy E, MD       loperamide (IMODIUM) capsule 2 mg  2 mg Oral Q4H PRN Bobbitt, Shalon E, NP   2 mg at 11/26/21 0946   magnesium hydroxide (MILK OF MAGNESIA) suspension 30 mL  30 mL Oral Daily PRN Margorie John W, PA-C       multivitamin with minerals tablet 1 tablet  1 tablet Oral Daily Margorie John W, PA-C   1 tablet at 11/27/21 0819   OLANZapine (ZYPREXA) tablet 2.5 mg  2.5 mg Oral BID Jarreau Callanan, Ovid Curd, MD   2.5 mg at 11/27/21 [id number]   And   OLANZapine (ZYPREXA) tablet 10 mg  10 mg Oral QHS Ozil Stettler, Ovid Curd, MD   10 mg at 11/26/21 2149   OLANZapine zydis (ZYPREXA) disintegrating tablet 5 mg  5 mg Oral Q8H PRN Herley Bernardini, Ovid Curd, MD   5 mg at 11/24/21 1358   ondansetron (ZOFRAN-ODT) disintegrating tablet 8 mg  8 mg Oral Q8H PRN Rosezetta Schlatter, MD       Oxcarbazepine (TRILEPTAL) tablet 300 mg  300 mg Oral BID Zeppelin Beckstrand,  Ovid Curd, MD   300 mg at 11/27/21 [id number]   thiamine tablet 100 mg  100 mg Oral Daily Margorie John W, PA-C   100 mg at 11/27/21 [id number]   traZODone (DESYREL) tablet 50 mg  50 mg Oral QHS PRN Tramain Gershman, Ovid Curd, MD        Lab Results:  No results found for this or any previous visit (from the past 48 hour(s)).  Blood Alcohol level:  Lab Results  Component Value Date   ETH <10 99991111    Metabolic Disorder Labs: Lab Results  Component Value Date   HGBA1C 4.8 11/19/2021   MPG 91.06 11/19/2021   No results found for: PROLACTIN Lab Results  Component Value Date   CHOL 138 11/19/2021   TRIG 249 (H) 11/19/2021   HDL 43 11/19/2021   CHOLHDL 3.2 11/19/2021   VLDL 50 (H) 11/19/2021   LDLCALC 45 11/19/2021    Physical Findings: AIMS: Facial and Oral Movements Muscles of Facial Expression: None, normal Lips and Perioral Area: None, normal Jaw: None, normal Tongue: None, normal,Extremity Movements Upper (arms, wrists, hands, fingers): None, normal Lower (legs, knees, ankles, toes): None, normal, Trunk Movements Neck, shoulders, hips: None, normal, Overall Severity Severity of abnormal movements (highest score from questions above): None, normal Incapacitation due to abnormal movements: None, normal Patient's awareness of abnormal movements (rate only patient's report): No Awareness, Dental Status Current problems with teeth and/or dentures?: No Does patient usually wear dentures?: No  CIWA:  CIWA-Ar Total: 0 COWS:     Musculoskeletal: Strength & Muscle Tone: within normal limits Gait & Station: normal Patient leans: N/A  Psychiatric Specialty Exam:  Presentation  General Appearance: Appropriate for Environment; Casual; Fairly Groomed  Eye Contact:Good  Speech:Clear and Coherent; Normal Rate  Speech Volume:Normal  Handedness:Right   Mood and Affect  Mood: euthymic   Affect:Appropriate; Congruent; Full Range   Thought Process  Thought  Processes:Linear  Descriptions of Associations:Intact  Orientation:Full (Time, Place and Person)  Thought Content: Denies having Paranoid Ideation today   History of Schizophrenia/Schizoaffective disorder:Yes  Duration of Psychotic Symptoms:Greater than six months  Hallucinations:denies Ideas of Reference: denies Delusions; denies Paranoia  Suicidal Thoughts:denies  Homicidal Thoughts:denies    Sensorium  Memory:Immediate Good; Recent Good; Remote Good  Judgment:improving  Insight:improving    Executive Functions  Concentration:Fair  Attention Span:Fair  [place] of Knowledge:Poor  Language:Good   Psychomotor Activity  Psychomotor Activity:Psychomotor Activity: Normal   Assets  Assets:Resilience   Sleep  Sleep:Sleep: Good    Physical Exam: Physical Exam Vitals reviewed.  Constitutional:      General: He is not in acute distress.    Appearance: He is normal weight.  Pulmonary:     Effort: Pulmonary effort is normal.  Neurological:     Mental Status: He is alert.     Motor: No weakness.     Gait: Gait normal.   Review of Systems  Constitutional:  Negative for chills and fever.  Cardiovascular:  Negative for chest pain and palpitations.  Musculoskeletal:  Positive for joint pain.  Psychiatric/Behavioral:  Negative for depression, hallucinations, substance abuse and suicidal ideas. The patient is not nervous/anxious and does not have insomnia.   All other systems reviewed and are negative.  Blood pressure 115/77, pulse 91, temperature 97.7 F (36.5 C), resp. rate 16, height 5' 7.5" (1.715 m), weight 78.1 kg, SpO2 97 %. Body mass index is 26.57 kg/m.   Treatment Plan Summary: Daily contact with patient to assess and evaluate symptoms and progress in treatment and Medication management   PLAN Safety and Monitoring: Involuntary admission to inpatient psychiatric unit for safety, stabilization and treatment Daily contact with  patient to assess and evaluate symptoms and progress in treatment Patient's case to be discussed in multi-disciplinary team meeting Observation Level : q15 minute checks Vital signs: q12 hours Precautions: suicide, elopement, and assault     Psychiatric Problems Schizophrenia (r/o bipolar disorder, r/o  substance induced psychosis, r/o antisocial personality disorder, r/o MDD with psychosis) Alcohol Use Disorder-Severe  -Continue Zyprexa 2.5 mg bid (8:00 and 14:00) and 10 mg at night for psychotic symptoms and mood lability.  - Continue trileptal 300 mg bid - for mood stabilization.  -Continue Gabapentin 300 mg tid for anxiety and alcohol craving -Agitation protocol: haldol, benadryl, ativan -Change Trazodone 50 mg from scheduled to as needed - for insomnia    Medical Problems - Repeat labs for morning, before discharge  TSH wnl, Lipid panel abnormal for triglycerides 249 and VLDL 50 but does not appear to be fasting, A1c wnl -Patient to be given influenza vaccination, per request.  -COVID negative    Left Shoulder Pain -Left shoulder CXR: There is no evidence of fracture or dislocation. There is no evidence of arthropathy or other focal bone abnormality. Soft tissues are unremarkable. -Tylenol 1000 mg bid for pain -Ibuprofen 800 tid prn for breakthrough pain   PRNs -Maalox/Mylanta 30 mL for indigestion -Hydroxyzine 25 mg tid for anxiety -Milk of Magnesia 30 mL for constipation -Zofran for nausea/vomiting   4. Discharge Planning: Social work and case management to assist with discharge planning and identification of hospital follow-up needs prior to discharge Estimated LOS: 5-7 days Discharge Concerns: Need to establish a safety plan; Medication compliance and effectiveness Discharge Goals: Return home with outpatient referrals for mental health follow-up including medication management/psychotherapy   Cristy Hilts, MD 11/27/2021, 11:58 AM  Total Time Spent in  Direct Patient Care:  I personally spent 30 minutes on the unit in direct patient care. The direct patient care time included face-to-face time with the patient, reviewing the patient's chart, communicating with other professionals, and coordinating care. Greater than 50% of this time was spent in counseling or coordinating care with the patient regarding goals of hospitalization, psycho-education, and discharge planning needs.   Phineas Inches, MD Psychiatrist

## 2021-11-27 NOTE — Progress Notes (Signed)
[facility] Post 1:1 Observation Documentation  For the first (8) hours following discontinuation of 1:1 precautions, a progress note entry by nursing staff should be documented at least every 2 hours, reflecting the patient's behavior, condition, mood, and conversation.  Use the progress notes for additional entries.  Time 1:1 discontinued:  1400   Patient's Behavior:  Patient is calm and appropriate.   Patient's Condition:  Patient is his room resting.   Patient's Conversation:  No interaction with staff or peers.  Delos Haring

## 2021-11-27 NOTE — Progress Notes (Signed)
Pt requested Trazodone for sleep, pt came later requesting more Trazodone to help him sleep. Pt was informed that the doctor changed the 50 mg x 1 nightly even though he has been receiving 100 mg total for the past few nights. NP Ene notified of pt situation

## 2021-11-27 NOTE — Progress Notes (Signed)
DAR NOTE: Patient presents with calm and mood.  Denies suicidal thoughts, auditory and visual hallucinations.  Rates depression at 10, hopelessness at 10, and anxiety at 10.  Maintained on routine safety checks.  Medications given as prescribed.  Support and encouragement offered as needed.  Attended group and participated.  States goal for today is "getting back to [place]."  Patient observed socializing with peers in the dayroom.  Offered no complaint.  Patient is safe on and off the unit.

## 2021-11-27 NOTE — Group Note (Signed)
Occupational Therapy Group Note  Group Topic:Coping Skills  Group Date: 11/27/2021 Start Time: 1400 End Time: 1435 Facilitators: Donne Hazel, OT   Group Description: Group encouraged increased engagement and participation through discussion focused on coping through music. Group members were encouraged to create a "coping skills playlist" that consisted of 20 songs with different cited categories/topics including "a song that reminds you of a good memory" and "a song that makes you want to dance", etc. Discussion followed with patients sharing their playlists and choosing one for the group to listen and respond too.   Therapeutic Goal(s): Identify positive songs to improve coping through music Identify and share benefit of music as a coping strategy  Participation Level: Active   Participation Quality: Independent   Behavior: Interactive    Speech/Thought Process: Disorganized   Affect/Mood: Full range   Insight: Limited   Judgement: Limited   Individualization: Angad was active in their participation of group discussion/activity. Pt identified "rap" and "folk pop" as his favorite genres of music and identified listening to music as a distraction and to help "connect the brain waves". Pt presented as disorganized and bizarre in his responses at times, sharing with this Clinical research associate that he train hopped from [place] to [place], however is originally from [place]. At close of group, this writer thanked pt, and pt stated 'why are you mad at me? What did I do?" Pt continues to question this Clinical research associate, appears suspicious of this Clinical research associate and continues to state "you are mad at me." Pt then walked down the hallway and back in group room for snack.  Modes of Intervention: Activity, Discussion, and Education  Patient Response to Interventions:  Disengaged, Engaged, and Skeptical    Plan: Continue to engage patient in OT groups 2 - 3x/week.  11/27/2021  Donne Hazel, MOT, OTR/L

## 2021-11-28 ENCOUNTER — Encounter (HOSPITAL_COMMUNITY): Payer: Self-pay | Admitting: Student

## 2021-11-28 ENCOUNTER — Encounter (HOSPITAL_COMMUNITY): Payer: Self-pay

## 2021-11-28 DIAGNOSIS — F250 Schizoaffective disorder, bipolar type: Secondary | ICD-10-CM | POA: Diagnosis not present

## 2021-11-28 LAB — HIV ANTIBODY (ROUTINE TESTING W REFLEX): HIV Screen 4th Generation wRfx: NONREACTIVE

## 2021-11-28 LAB — CBC
HCT: 43.2 % (ref 39.0–52.0)
Hemoglobin: 14.5 g/dL (ref 13.0–17.0)
MCH: 31.5 pg (ref 26.0–34.0)
MCHC: 33.6 g/dL (ref 30.0–36.0)
MCV: 93.9 fL (ref 80.0–100.0)
Platelets: 164 10*3/uL (ref 150–400)
RBC: 4.6 MIL/uL (ref 4.22–5.81)
RDW: 12.8 % (ref 11.5–15.5)
WBC: 5.3 10*3/uL (ref 4.0–10.5)
nRBC: 0 % (ref 0.0–0.2)

## 2021-11-28 LAB — COMPREHENSIVE METABOLIC PANEL
ALT: 258 U/L — ABNORMAL HIGH (ref 0–44)
AST: 111 U/L — ABNORMAL HIGH (ref 15–41)
Albumin: 4 g/dL (ref 3.5–5.0)
Alkaline Phosphatase: 80 U/L (ref 38–126)
Anion gap: 8 (ref 5–15)
BUN: 22 mg/dL — ABNORMAL HIGH (ref 6–20)
CO2: 25 mmol/L (ref 22–32)
Calcium: 9.4 mg/dL (ref 8.9–10.3)
Chloride: 105 mmol/L (ref 98–111)
Creatinine, Ser: 0.86 mg/dL (ref 0.61–1.24)
GFR, Estimated: >60 mL/min (ref >60)
Glucose, Bld: 108 mg/dL — ABNORMAL HIGH (ref 70–99)
Potassium: 4.2 mmol/L (ref 3.5–5.1)
Sodium: 138 mmol/L (ref 135–145)
Total Bilirubin: 0.7 mg/dL (ref 0.3–1.2)
Total Protein: 6.9 g/dL (ref 6.5–8.1)

## 2021-11-28 LAB — LIPASE, BLOOD: Lipase: 32 U/L (ref 11–51)

## 2021-11-28 LAB — AMYLASE: Amylase: 96 U/L (ref 28–100)

## 2021-11-28 LAB — VALPROIC ACID LEVEL: Valproic Acid Lvl: <10 ug/mL — ABNORMAL LOW (ref 50.0–100.0)

## 2021-11-28 LAB — PROTIME-INR
INR: 0.9 (ref 0.8–1.2)
Prothrombin Time: 11.7 seconds (ref 11.4–15.2)

## 2021-11-28 LAB — RAPID HIV SCREEN (HIV 1/2 AB+AG)
HIV 1/2 Antibodies: NONREACTIVE
HIV-1 P24 Antigen - HIV24: NONREACTIVE

## 2021-11-28 LAB — ACETAMINOPHEN LEVEL: Acetaminophen (Tylenol), Serum: <10 ug/mL — ABNORMAL LOW (ref 10–30)

## 2021-11-28 LAB — SALICYLATE LEVEL: Salicylate Lvl: <7 mg/dL — ABNORMAL LOW (ref 7.0–30.0)

## 2021-11-28 MED ORDER — TRAZODONE HCL 50 MG PO TABS
50.0000 mg | ORAL_TABLET | Freq: Once | ORAL | Status: AC
  Administered 2021-11-28: 01:00:00 50 mg via ORAL
  Filled 2021-11-28 (×2): qty 1

## 2021-11-28 NOTE — Group Note (Signed)
Type of Therapy and Topic:  Group Therapy:  Stress Management °  °Participation Level:  Did Not Attend  °  °Description of Group:  Patients in this group were introduced to the idea of stress and encouraged to discuss negative and positive ways to manage stress. Patients discussed specific stressors that they have in their life right now and the physical signs and symptoms associated with that stress.  Patient encouraged to come up with positive changes to assist with the stress upon discharge in order to prevent future hospitalizations.   They also worked as a group on developing a specific plan for several patients to deal with stressors through boundary-setting, psychoeducation and self care techniques °  °Therapeutic Goals: °  °            1)  To discuss the positive and negative impacts of stress °            2)  identify signs and symptoms of stress °            3)  generate ideas for stress management °            4)  offer mutual support to others regarding stress management °            5)  Developing plans for ways to manage specific stressors upon discharge °            °  °Summary of Patient Progress:  Did not attend °  °Therapeutic Modalities:   °Motivational Interviewing °Brief Solution-Focused Therapy °

## 2021-11-28 NOTE — Progress Notes (Addendum)
[facility] MD Progress Note  11/28/2021 2:13 PM Maurice [name]  MRN:  [id number] Subjective:    Patient is a 35 year old male with reported psychiatric history of schizophrenia, bipolar disorder, heroin use disorder, cocaine use disorder, alcohol use disorder presents to behavioral health hospital from [facility], ED due to suicidal ideation. On admission, pt was manic and psychotic.    Per nursing, pt is improving and participating more in groups. No behavioral or aggression issues since close obs was d/c.  Psychiatry team made following recommendations yesterday:  -Continue Zyprexa 2.5 mg bid (8:00 and 14:00) and 10 mg at night for psychotic symptoms and mood lability.  - Continue trileptal 300 mg bid - for mood stabilization.  -Continue Gabapentin 300 mg tid for anxiety and alcohol craving -Agitation protocol: haldol, benadryl, ativan -Change Trazodone 50 mg from scheduled to as needed - for insomni  On my evaluation today, pt is more interactive and less sedated.  He is distracted and looking and commenting at the other patient who was pacing in the hallway.  Patient states his mood is "alright "but he feels depressed and anxious because other patient who was pacing in the hallway is racist.  He denies feeling depressed or sad. He reports feeling anxious 2/2 hepatitis.  Discussed that his liver enzymes are high which may be due to medications, hepatitis or other medical reasons.  He gets anxious and states" do I have HIV?".  Discussed that we need to do full STD panel to check for HIV and other infections.  He states that he recently had sex with a gay male.   He gives consent for full STD testing including HIV. He again fixated on infection and asks if he has HIV or hepatitis B.   He denies having paranoid thoughts.  There is no evidence of delusional thought construct. Pt denies having AH or VH.  Patient denies any first rank symptoms or ideas of reference.  He states he did not sleep well last  night as he he kept waking up.  He reports stable appetite.  Pt denies SI and HI.  He denies having other s/e to current psych meds.   Called on-call [facility] and spoke to the PA for patient's elevated liver enzymes.  PA stated that patient has to be transferred to ED for evaluation. Called [facility] ED and talked to Dr. Estell Harpin.  Patient will be transferred to [facility] ED for Evaluation and GI consult  for increased liver enzymes.    Later today:  Informed patient that we have to transfer him to [facility] for evaluation of high liver enzymes. Patient agrees with the plan.     Principal Problem: Schizoaffective disorder, bipolar type (HCC) Diagnosis: Principal Problem:   Schizoaffective disorder, bipolar type (HCC)  Total Time spent with patient: 20 minutes  Past Psychiatric History: See H&P  Past Medical History:  Past Medical History:  Diagnosis Date   Bipolar disorder (HCC)    Manic depressive disorder (HCC)    Medical history non-contributory    Schizophrenia (HCC)     Past Surgical History:  Procedure Laterality Date   NO PAST SURGERIES     Family History: History reviewed. No pertinent family history. Family Psychiatric  History: See H&P  Social History:  Social History   Substance and Sexual Activity  Alcohol Use Yes   Comment: pt states drinks a gallon of liquor and beer mixed daily     Social History   Substance and Sexual  Activity  Drug Use Yes   Types: Cocaine    Social History   Socioeconomic History   Marital status: Single    Spouse name: Not on file   Number of children: Not on file   Years of education: Not on file   Highest education level: Not on file  Occupational History   Not on file  Tobacco Use   Smoking status: Some Days    Types: Cigarettes   Smokeless tobacco: Never  Vaping Use   Vaping Use: Never used  Substance and Sexual Activity   Alcohol use: Yes    Comment: pt states drinks a gallon of liquor and beer mixed daily    Drug use: Yes    Types: Cocaine   Sexual activity: Not Currently  Other Topics Concern   Not on file  Social History Narrative   Not on file   Social Determinants of Health   Financial Resource Strain: Not on file  Food Insecurity: Not on file  Transportation Needs: Not on file  Physical Activity: Not on file  Stress: Not on file  Social Connections: Not on file   Additional Social History:                         Sleep: Fair   Appetite:  Good  Current Medications: Current Facility-Administered Medications  Medication Dose Route Frequency Provider Last Rate Last Admin   alum & mag hydroxide-simeth (MAALOX/MYLANTA) 200-200-20 MG/5ML suspension 30 mL  30 mL Oral Q4H PRN Margorie John W, PA-C       haloperidol lactate (HALDOL) injection 5 mg  5 mg Intramuscular Q6H PRN France Ravens, MD       And   LORazepam (ATIVAN) injection 2 mg  2 mg Intramuscular Q6H PRN France Ravens, MD       And   diphenhydrAMINE (BENADRYL) injection 50 mg  50 mg Intramuscular Q6H PRN France Ravens, MD       feeding supplement (BOOST / RESOURCE BREEZE) liquid 1 Container  237 mL Oral BID BM France Ravens, MD   237 mL at 11/27/21 1111   gabapentin (NEURONTIN) capsule 300 mg  300 mg Oral TID Janine Limbo, MD   300 mg at 11/28/21 1338   ibuprofen (ADVIL) tablet 800 mg  800 mg Oral Q8H PRN France Ravens, MD   800 mg at 11/27/21 1933   influenza vac split quadrivalent PF (FLUARIX) injection 0.5 mL  0.5 mL Intramuscular Tomorrow-1000 Nelda Marseille, Amy E, MD       loperamide (IMODIUM) capsule 2 mg  2 mg Oral Q4H PRN Bobbitt, Shalon E, NP   2 mg at 11/28/21 [id number]   magnesium hydroxide (MILK OF MAGNESIA) suspension 30 mL  30 mL Oral Daily PRN Margorie John W, PA-C       multivitamin with minerals tablet 1 tablet  1 tablet Oral Daily Margorie John W, PA-C   1 tablet at 11/28/21 0842   OLANZapine (ZYPREXA) tablet 2.5 mg  2.5 mg Oral BID Tranae Laramie, Ovid Curd, MD   2.5 mg at 11/28/21 1338   And   OLANZapine (ZYPREXA) tablet  10 mg  10 mg Oral QHS Kayliana Codd, Ovid Curd, MD   10 mg at 11/27/21 2102   OLANZapine zydis (ZYPREXA) disintegrating tablet 5 mg  5 mg Oral Q8H PRN Katherine Syme, Ovid Curd, MD   5 mg at 11/24/21 1358   ondansetron (ZOFRAN-ODT) disintegrating tablet 8 mg  8 mg Oral Q8H PRN Rosezetta Schlatter, MD  Oxcarbazepine (TRILEPTAL) tablet 300 mg  300 mg Oral BID Janine Limbo, MD   300 mg at 11/28/21 [id number]   thiamine tablet 100 mg  100 mg Oral Daily Margorie John W, PA-C   100 mg at 11/28/21 [id number]   traZODone (DESYREL) tablet 50 mg  50 mg Oral QHS PRN Janine Limbo, MD   50 mg at 11/27/21 2102   Current Outpatient Medications  Medication Sig Dispense Refill   ibuprofen (ADVIL) 200 MG tablet Take 400 mg by mouth every 6 (six) hours as needed for headache or moderate pain.      Lab Results:  Results for orders placed or performed during the hospital encounter of 11/18/21 (from the past 48 hour(s))  Comprehensive metabolic panel     Status: Abnormal   Collection Time: 11/28/21  6:16 AM  Result Value Ref Range   Sodium 138 135 - 145 mmol/L   Potassium 4.2 3.5 - 5.1 mmol/L   Chloride 105 98 - 111 mmol/L   CO2 25 22 - 32 mmol/L   Glucose, Bld 108 (H) 70 - 99 mg/dL    Comment: Glucose reference range applies only to samples taken after fasting for at least 8 hours.   BUN 22 (H) 6 - 20 mg/dL   Creatinine, Ser 0.86 0.61 - 1.24 mg/dL   Calcium 9.4 8.9 - 10.3 mg/dL   Total Protein 6.9 6.5 - 8.1 g/dL   Albumin 4.0 3.5 - 5.0 g/dL   AST 111 (H) 15 - 41 U/L   ALT 258 (H) 0 - 44 U/L   Alkaline Phosphatase 80 38 - 126 U/L   Total Bilirubin 0.7 0.3 - 1.2 mg/dL   GFR, Estimated >60 >60 mL/min    Comment: (NOTE) Calculated using the CKD-EPI Creatinine Equation (2021)    Anion gap 8 5 - 15    Comment: Performed at [facility], [place] [address], [place], [place] [address]  CBC     Status: None   Collection Time: 11/28/21  6:16 AM  Result Value Ref Range   WBC 5.3 4.0 - 10.5 K/uL   RBC  4.60 4.22 - 5.81 MIL/uL   Hemoglobin 14.5 13.0 - 17.0 g/dL   HCT 43.2 39.0 - 52.0 %   MCV 93.9 80.0 - 100.0 fL   MCH 31.5 26.0 - 34.0 pg   MCHC 33.6 30.0 - 36.0 g/dL   RDW 12.8 11.5 - 15.5 %   Platelets 164 150 - 400 K/uL   nRBC 0.0 0.0 - 0.2 %    Comment: Performed at [facility], [place] [address] [place], [place] [address]  Valproic acid level     Status: Abnormal   Collection Time: 11/28/21  6:16 AM  Result Value Ref Range   Valproic Acid Lvl <10 (L) 50.0 - 100.0 ug/mL    Comment: Performed at [facility], [place] [address], [place], [place] [address]  Amylase     Status: None   Collection Time: 11/28/21  6:16 AM  Result Value Ref Range   Amylase 96 28 - 100 U/L    Comment: Performed at [facility], [place] [address], [place], [place] [address]  Lipase, blood     Status: None   Collection Time: 11/28/21  6:16 AM  Result Value Ref Range   Lipase 32 11 - 51 U/L    Comment: Performed at [facility], [place] [address], [place],  [address]     Blood Alcohol  level:  Lab Results  Component Value Date   ETH <10 99991111    Metabolic Disorder Labs: Lab Results  Component Value Date   HGBA1C 4.8 11/19/2021   MPG 91.06 11/19/2021   No results found for: PROLACTIN Lab Results  Component Value Date   CHOL 138 11/19/2021   TRIG 249 (H) 11/19/2021   HDL 43 11/19/2021   CHOLHDL 3.2 11/19/2021   VLDL 50 (H) 11/19/2021   LDLCALC 45 11/19/2021    Physical Findings: AIMS: Facial and Oral Movements Muscles of Facial Expression: None, normal Lips and Perioral Area: None, normal Jaw: None, normal Tongue: None, normal,Extremity Movements Upper (arms, wrists, hands, fingers): None, normal Lower (legs, knees, ankles, toes): None, normal, Trunk Movements Neck, shoulders, hips: None, normal, Overall Severity Severity of abnormal movements (highest score from questions above): None, normal Incapacitation  due to abnormal movements: None, normal Patient's awareness of abnormal movements (rate only patient's report): No Awareness, Dental Status Current problems with teeth and/or dentures?: No Does patient usually wear dentures?: No  CIWA:  CIWA-Ar Total: 0 COWS:     Musculoskeletal: Strength & Muscle Tone: within normal limits Gait & Station: normal Patient leans: N/A  Psychiatric Specialty Exam:  Presentation  General Appearance: Appropriate for Environment; Casual; Fairly Groomed  Eye Contact:Good  Speech:Clear and Coherent; Normal Rate  Speech Volume:Normal  Handedness:Right   Mood and Affect  Mood: euthymic   Affect:Restricted; Congruent   Thought Process  Thought Processes:Linear; Coherent  Descriptions of Associations:Tangential  Orientation:Full (Time, Place and Person)  Thought Content: Denies having Paranoid Ideation today   History of Schizophrenia/Schizoaffective disorder:Yes  Duration of Psychotic Symptoms:Greater than six months  Hallucinations:denies Ideas of Reference: denies Delusions; denies Paranoia  Suicidal Thoughts:denies  Homicidal Thoughts:denies    Sensorium  Memory:Immediate Good; Recent Good; Remote Good  Judgment:improving  Insight:improving    Executive Functions  Concentration:Fair  Attention Span:Fair  [place] of Knowledge:Poor  Language:Good   Psychomotor Activity  Psychomotor Activity:Psychomotor Activity: Normal   Assets  Assets:Resilience; Communication Skills   Sleep  Sleep:Sleep: Fair    Physical Exam: Physical Exam Vitals reviewed.  Constitutional:      General: He is not in acute distress.    Appearance: He is normal weight.  Pulmonary:     Effort: Pulmonary effort is normal.  Neurological:     Mental Status: He is alert.     Motor: No weakness.     Gait: Gait normal.   Review of Systems  Constitutional:  Negative for chills and fever.  Cardiovascular:  Negative for chest  pain and palpitations.  Psychiatric/Behavioral:  Positive for depression. Negative for hallucinations, substance abuse and suicidal ideas. The patient is nervous/anxious. The patient does not have insomnia.   All other systems reviewed and are negative.  Blood pressure 102/72, pulse 78, temperature 97.7 F (36.5 C), resp. rate 16, height 5' 7.5" (1.715 m), weight 78.1 kg, SpO2 97 %. Body mass index is 26.57 kg/m.   Treatment Plan Summary: Daily contact with patient to assess and evaluate symptoms and progress in treatment and Medication management   PLAN Safety and Monitoring: Involuntary admission to inpatient psychiatric unit for safety, stabilization and treatment Daily contact with patient to assess and evaluate symptoms and progress in treatment Patient's case to be discussed in multi-disciplinary team meeting Observation Level : q15 minute checks Vital signs: q12 hours Precautions: suicide, elopement, and assault     Psychiatric Problems Schizophrenia (r/o bipolar disorder, r/o substance induced psychosis, r/o antisocial personality  disorder, r/o MDD with psychosis) Alcohol Use Disorder-Severe  -Continue Zyprexa 2.5 mg bid (8:00 and 14:00) and 10 mg at night for psychotic symptoms and mood lability.  - Continue trileptal 300 mg bid - for mood stabilization. We can d/c this, if GI consult recommends this as possible reason for elevated LFTs.  -Continue Gabapentin 300 mg tid for anxiety and alcohol craving -Agitation protocol: haldol, benadryl, ativan -Continue trazodone 50 mg as needed - for insomnia    Medical Problems TSH wnl, Lipid panel abnormal for triglycerides 249 and VLDL 50 but does not appear to be fasting, A1c wnl CMP shows BUN 22, AST elevated at 111, ALT elevated at 258 (increased from 67/74 5 days ago), CBC within normal limits, valproic acid level less than 10, amylase 96, lipase 32, Hepatitis panel, PT/INR pending -Patient to be given influenza vaccination,  per request.  -COVID negative    Elevated AST/ALT AST 111, ALT 258 Transfer patient to [facility] ED for evaluation and GI consult.  [facility] ED and talked to Dr. Roderic Palau.    -Hepatitis panel pending  ?  STD  -Order HIV, RPR, GC/chlamydia per patient's request.  Left Shoulder Pain -Left shoulder CXR: There is no evidence of fracture or dislocation. There is no evidence of arthropathy or other focal bone abnormality. Soft tissues are unremarkable. -Tylenol 1000 mg bid for pain -Ibuprofen 800 tid prn for breakthrough pain   PRNs -Maalox/Mylanta 30 mL for indigestion -Hydroxyzine 25 mg tid for anxiety -Milk of Magnesia 30 mL for constipation -Zofran for nausea/vomiting   4. Discharge Planning: Social work and case management to assist with discharge planning and identification of hospital follow-up needs prior to discharge Estimated LOS: 5-7 days Discharge Concerns: Need to establish a safety plan; Medication compliance and effectiveness Discharge Goals: Return home with outpatient referrals for mental health follow-up including medication management/psychotherapy   Armando Reichert, MD PGy2 11/28/2021, 2:13 PM  Total Time Spent in Direct Patient Care:  I personally spent 30 minutes on the unit in direct patient care. The direct patient care time included face-to-face time with the patient, reviewing the patient's chart, communicating with other professionals, and coordinating care. Greater than 50% of this time was spent in counseling or coordinating care with the patient regarding goals of hospitalization, psycho-education, and discharge planning needs.

## 2021-11-28 NOTE — Discharge Instructions (Addendum)
Your physical exam was reassuring.  Your Tylenol level is less than 10.  Your INR was within normal limits.  Your hepatitis panel is pending at this time.  You will need to have your CMP rechecked in 48 hours.  If this is trending downwards or unchanged no further emergency management is required.  You will need to follow-up with your primary care provider in the outpatient setting.  Please do not use any Tylenol medication until your liver enzymes normalize.

## 2021-11-28 NOTE — Plan of Care (Cosign Needed Addendum)
Called on-call [facility] and spoke to the PA for patient's elevated liver enzymes.  PA stated that patient has to be transferred to ED for evaluation. Called [facility] ED and talked to Dr. Estell Harpin.  Patient will be transferred to [facility] ED for Evaluation and GI consult  for increased liver enzymes.   Dr Karsten Ro MD PGY 2  [facility] psychiatry

## 2021-11-28 NOTE — ED Provider Notes (Signed)
Emergency Medicine Provider Triage Evaluation Note  Maurice [name] , a 35 y.o. male  was evaluated in triage.  Pt complains of elevated liver enzymes.  Patient was sent from behavioral health due to elevated liver enzymes.  Per chart review AST 111 and ALT 208 earlier today.  Levels were normal 11 days prior.  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  States that he "drinks all he can when he can get it."  States that he was drinking heavily prior to admission to behavioral health.  Patient reports that he has been receiving Tylenol every 8 hours while at behavioral health.  Patient denies taking any medications outside of medicines he has been receiving from behavioral health.  Review of Systems  Positive:  Negative: Fever, chills, abdominal pain  Physical Exam  BP (!) 116/59 (BP Location: Right Arm)    Pulse 86    Temp 98.7 F (37.1 C) (Oral)    Resp 14    Ht 5' 7.5" (1.715 m)    Wt 75.8 kg    SpO2 98%    BMI 25.77 kg/m  Gen:   Awake, no distress   Resp:  Normal effort  MSK:   Moves extremities without difficulty  Other:    Medical Decision Making  Medically screening exam initiated at 2:44 PM.  Appropriate orders placed.  Doniven Vanpatten was informed that the remainder of the evaluation will be completed by another provider, this initial triage assessment does not replace that evaluation, and the importance of remaining in the ED until their evaluation is complete.  Hepatitis panel, Tylenol level, salicylate level, INR ordered.   Lavar, [place], PA-C 11/28/21 1447    Derwood Kaplan, MD 11/28/21 [phone]

## 2021-11-28 NOTE — Progress Notes (Signed)
Adult Psychoeducational Group Note  Date:  11/28/2021 Time:  8:55 PM  Group Topic/Focus:  Wrap-Up Group:   The focus of this group is to help patients review their daily goal of treatment and discuss progress on daily workbooks.  Participation Level:  Minimal  Participation Quality:  Drowsy  Affect:  Appropriate  Cognitive:  Appropriate  Insight: Appropriate  Engagement in Group:  Limited  Modes of Intervention:  Discussion  Additional Comments:   Pt stated his goal for today was to focus on his treatment plan. Pt stated he accomplished his goal today. Pt stated he talked with his doctor and social worker about his care today. Pt rated his overall day a 10. Pt stated he was able to contact his sister today which improved his overall day. Pt stated he felt better about himself today. Pt stated he attend all meals today. Pt stated he took all medications provided today. Pt stated he attend all groups held today. Pt stated his appetite was pretty good today. Pt rated sleep last night was fair. Pt stated the goal tonight was to get some rest. Pt stated he had some physical pain tonight.Pt stated he had some moderate pain in his left shoulder. Pt rated the moderate pain in his left shoulder a 7 on the pain level scale. Pt nurse was updated on the situation. Pt deny visual hallucinations and auditory issues tonight. Pt denies thoughts of harming himself or others. Pt stated he would alert staff if anything changed.  Maurice [name] 11/28/2021, 8:55 PM

## 2021-11-28 NOTE — ED Provider Notes (Signed)
[facility]-EMERGENCY DEPT Provider Note   CSN: [id number] Arrival date & time: 11/28/21  1406     History Chief Complaint  Patient presents with   Manic Behavior   abnormal labs    Maurice [name] is a 35 y.o. male with a history of bipolar disorder, manic depressive disorder, schizophrenia.  Presents to the emergency department from behavioral health for medical clearance due to elevated liver enzymes.  Per chart review patient had CMP drawn today at [phone] which showed AST elevated at 111 and ALT elevated at 258.  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  States that he "drinks all he can when he can get it."  States that he was drinking heavily prior to admission to behavioral health.  Patient reports that he has been receiving Tylenol every 8 hours while at behavioral health.  Patient denies taking any medications outside of medicines he has been receiving from behavioral health.  Per chart review patient has been receiving Depakote, trazodone, olanzapine, Trileptal, Atarax and Ativan while in behavioral health.  Patient denies any abdominal pain, nausea, vomiting, diarrhea, fevers, chills.    HPI     Past Medical History:  Diagnosis Date   Bipolar disorder (HCC)    Manic depressive disorder (HCC)    Medical history non-contributory    Schizophrenia [facility])     Patient Active Problem List   Diagnosis Date Noted   Schizoaffective disorder, bipolar type (HCC) 11/21/2021   Bipolar disorder (HCC) 11/18/2021   Suicidal ideations 11/18/2021    Past Surgical History:  Procedure Laterality Date   NO PAST SURGERIES         History reviewed. No pertinent family history.  Social History   Tobacco Use   Smoking status: Some Days    Types: Cigarettes   Smokeless tobacco: Never  Vaping Use   Vaping Use: Never used  Substance Use Topics   Alcohol use: Yes    Comment: pt states drinks a gallon of liquor and beer mixed daily    Drug use: Yes    Types: Cocaine    Home Medications Prior to Admission medications   Medication Sig Start Date End Date Taking? Authorizing Provider  ibuprofen (ADVIL) 200 MG tablet Take 400 mg by mouth every 6 (six) hours as needed for headache or moderate pain.    [provider]    Allergies    Patient has no known allergies.  Review of Systems   Review of Systems  Constitutional:  Negative for chills and fever.  Eyes:  Negative for visual disturbance.  Respiratory:  Negative for shortness of breath.   Cardiovascular:  Negative for chest pain.  Gastrointestinal:  Negative for abdominal distention, abdominal pain, anal bleeding, blood in stool, constipation, diarrhea, nausea, rectal pain and vomiting.  Genitourinary:  Negative for difficulty urinating, dysuria, frequency and hematuria.  Musculoskeletal:  Negative for back pain and neck pain.  Skin:  Negative for color change and rash.  Neurological:  Negative for dizziness, syncope, light-headedness and headaches.  Psychiatric/Behavioral:  Negative for confusion.    Physical Exam Updated Vital Signs BP (!) 116/59 (BP Location: Right Arm)    Pulse 86    Temp 98.7 F (37.1 C) (Oral)    Resp 14    Ht 5' 7.5" (1.715 m)    Wt 75.8 kg    SpO2 98%    BMI 25.77 kg/m   Physical Exam Vitals and nursing note reviewed.  Constitutional:  General: He is not in acute distress.    Appearance: He is not ill-appearing, toxic-appearing or diaphoretic.  HENT:     Head: Normocephalic.  Eyes:     General: No scleral icterus.       Right eye: No discharge.        Left eye: No discharge.  Cardiovascular:     Rate and Rhythm: Normal rate.  Pulmonary:     Effort: Pulmonary effort is normal.  Abdominal:     General: Abdomen is flat. There is no distension. There are no signs of injury.     Palpations: Abdomen is soft. There is no mass or pulsatile mass.     Tenderness: There is no abdominal tenderness. There is no guarding or  rebound.  Skin:    General: Skin is warm and dry.  Neurological:     General: No focal deficit present.     Mental Status: He is alert.  Psychiatric:        Behavior: Behavior is cooperative.    ED Results / Procedures / Treatments   Labs (all labs ordered are listed, but only abnormal results are displayed) Labs Reviewed  LIPID PANEL - Abnormal; Notable for the following components:      Result Value   Triglycerides 249 (*)    VLDL 50 (*)    All other components within normal limits  COMPREHENSIVE METABOLIC PANEL - Abnormal; Notable for the following components:   Glucose, Bld 148 (*)    Total Protein 6.4 (*)    AST 67 (*)    ALT 74 (*)    All other components within normal limits  VALPROIC ACID LEVEL - Abnormal; Notable for the following components:   Valproic Acid Lvl 36 (*)    All other components within normal limits  COMPREHENSIVE METABOLIC PANEL - Abnormal; Notable for the following components:   Glucose, Bld 108 (*)    BUN 22 (*)    AST 111 (*)    ALT 258 (*)    All other components within normal limits  VALPROIC ACID LEVEL - Abnormal; Notable for the following components:   Valproic Acid Lvl <10 (*)    All other components within normal limits  ACETAMINOPHEN LEVEL - Abnormal; Notable for the following components:   Acetaminophen (Tylenol), Serum <10 (*)    All other components within normal limits  SALICYLATE LEVEL - Abnormal; Notable for the following components:   Salicylate Lvl [id number] (*)    All other components within normal limits  RESP PANEL BY RT-PCR (FLU A&B, COVID) ARPGX2  HEMOGLOBIN A1C  TSH  CBC  CK  CBC  AMYLASE  LIPASE, BLOOD  PROTIME-INR  HIV ANTIBODY (ROUTINE TESTING W REFLEX)  RAPID HIV SCREEN (HIV 1/2 AB+AG)  HEPATITIS PANEL, ACUTE  RPR  GC/CHLAMYDIA PROBE AMP ([place]) NOT AT [facility]    EKG None  Radiology No results found.  Procedures Procedures   Medications Ordered in ED Medications  alum & mag hydroxide-simeth  (MAALOX/MYLANTA) 200-200-20 MG/5ML suspension 30 mL (has no administration in time range)  magnesium hydroxide (MILK OF MAGNESIA) suspension 30 mL (has no administration in time range)  thiamine tablet 100 mg (100 mg Oral Given 11/28/21 0843)  multivitamin with minerals tablet 1 tablet (1 tablet Oral Given 11/28/21 0842)  LORazepam (ATIVAN) tablet 1 mg (has no administration in time range)  loperamide (IMODIUM) capsule 2-4 mg (has no administration in time range)  ondansetron (ZOFRAN-ODT) disintegrating tablet 4 mg (has no administration in time  range)  hydrOXYzine (ATARAX) tablet 25 mg (25 mg Oral Given 11/21/21 1734)  influenza vac split quadrivalent PF (FLUARIX) injection 0.5 mL (0 mLs Intramuscular Hold 11/19/21 0937)  haloperidol lactate (HALDOL) injection 5 mg (has no administration in time range)    And  LORazepam (ATIVAN) injection 2 mg (has no administration in time range)    And  diphenhydrAMINE (BENADRYL) injection 50 mg (has no administration in time range)  OLANZapine zydis (ZYPREXA) disintegrating tablet 5 mg (5 mg Oral Given 11/24/21 1358)    And  LORazepam (ATIVAN) tablet 1 mg (1 mg Oral Given 11/20/21 1752)  feeding supplement (BOOST / RESOURCE BREEZE) liquid 1 Container (1 Container Oral Not Given 11/28/21 1338)  gabapentin (NEURONTIN) capsule 300 mg (300 mg Oral Given 11/28/21 1338)  ondansetron (ZOFRAN-ODT) disintegrating tablet 8 mg (has no administration in time range)  ibuprofen (ADVIL) tablet 800 mg (800 mg Oral Given 11/27/21 1933)  loperamide (IMODIUM) capsule 2 mg (2 mg Oral Given 11/28/21 0508)  OXcarbazepine (TRILEPTAL) tablet 150 mg (150 mg Oral Given 11/26/21 0939)    Followed by  Oxcarbazepine (TRILEPTAL) tablet 300 mg (300 mg Oral Given 11/28/21 0842)  OLANZapine (ZYPREXA) tablet 2.5 mg (2.5 mg Oral Given 11/28/21 1338)    And  OLANZapine (ZYPREXA) tablet 10 mg (10 mg Oral Given 11/27/21 2102)  traZODone (DESYREL) tablet 50 mg (50 mg Oral Given 11/27/21  2102)  traZODone (DESYREL) tablet 50 mg (50 mg Oral Given 11/21/21 2149)  influenza vac split quadrivalent PF (FLUARIX) injection 0.5 mL (0.5 mLs Intramuscular Given 11/22/21 1338)  traZODone (DESYREL) tablet 50 mg (50 mg Oral Given 11/24/21 0113)  divalproex (DEPAKOTE) DR tablet 250 mg (250 mg Oral Given 11/25/21 2319)  divalproex (DEPAKOTE) DR tablet 125 mg (125 mg Oral Given 11/26/21 2150)  traZODone (DESYREL) tablet 50 mg (50 mg Oral Given 11/28/21 0044)    ED Course  I have reviewed the triage vital signs and the nursing notes.  Pertinent labs & imaging results that were available during my care of the patient were reviewed by me and considered in my medical decision making (see chart for details).    MDM Rules/Calculators/A&P                          Alert 35 year old male no acute distress, nontoxic-appearing.  Sent to emergency department from behavioral health for evaluation of elevated liver enzymes.  Per chart review patient had normal liver enzymes 11 days prior.  Now currently elevated at AST elevated at 111 and ALT elevated at 258.  Physical exam is reassuring.  Tylenol and salicylate level within normal limits INR within normal limits Hepatitis panel pending.  Patient will need to have CMP rechecked in 48 hours.  If liver enzymes are improving or main consult no further emergent work-up is needed.  Patient will need to follow-up with PCP in outpatient setting.  Patient care was discussed with attending physician Dr. Kathrynn Humble.     Final Clinical Impression(s) / ED Diagnoses Final diagnoses:  Shoulder pain  Schizoaffective disorder, bipolar type ([place])    Rx / DC Orders ED Discharge Orders          Ordered    [facility] pharmacy consult for medication samples       Comments: 7 days please of medications, as patient will be going to [place] immediately upon discharge.  Please fill: olanzapine, trileptal, gabapentin Thank you  Provider:  (Not yet assigned)    11/27/21 1157  Pranav, Anderle, PA-C 11/28/21 1833    Varney Biles, MD 11/29/21 [phone]

## 2021-11-28 NOTE — BH IP Treatment Plan (Signed)
Interdisciplinary Treatment and Diagnostic Plan Update  11/28/2021 Moyses Pavey MRN: [id number]  Principal Diagnosis: Schizoaffective disorder, bipolar type [facility])  Secondary Diagnoses: Principal Problem:   Schizoaffective disorder, bipolar type (HCC)   Current Medications:  Current Facility-Administered Medications  Medication Dose Route Frequency Provider Last Rate Last Admin   alum & mag hydroxide-simeth (MAALOX/MYLANTA) 200-200-20 MG/5ML suspension 30 mL  30 mL Oral Q4H PRN Melbourne Abts W, PA-C       haloperidol lactate (HALDOL) injection 5 mg  5 mg Intramuscular Q6H PRN Park Pope, MD       And   LORazepam (ATIVAN) injection 2 mg  2 mg Intramuscular Q6H PRN Park Pope, MD       And   diphenhydrAMINE (BENADRYL) injection 50 mg  50 mg Intramuscular Q6H PRN Park Pope, MD       feeding supplement (BOOST / RESOURCE BREEZE) liquid 1 Container  237 mL Oral BID BM Park Pope, MD   237 mL at 11/27/21 1111   gabapentin (NEURONTIN) capsule 300 mg  300 mg Oral TID Phineas Inches, MD   300 mg at 11/28/21 0842   ibuprofen (ADVIL) tablet 800 mg  800 mg Oral Q8H PRN Park Pope, MD   800 mg at 11/27/21 1933   influenza vac split quadrivalent PF (FLUARIX) injection 0.5 mL  0.5 mL Intramuscular Tomorrow-1000 Mason Jim, Amy E, MD       loperamide (IMODIUM) capsule 2 mg  2 mg Oral Q4H PRN Bobbitt, Shalon E, NP   2 mg at 11/28/21 6160   magnesium hydroxide (MILK OF MAGNESIA) suspension 30 mL  30 mL Oral Daily PRN Melbourne Abts W, PA-C       multivitamin with minerals tablet 1 tablet  1 tablet Oral Daily Melbourne Abts W, PA-C   1 tablet at 11/28/21 0842   OLANZapine (ZYPREXA) tablet 2.5 mg  2.5 mg Oral BID Massengill, Harrold Donath, MD   2.5 mg at 11/28/21 7371   And   OLANZapine (ZYPREXA) tablet 10 mg  10 mg Oral QHS Massengill, Harrold Donath, MD   10 mg at 11/27/21 2102   OLANZapine zydis (ZYPREXA) disintegrating tablet 5 mg  5 mg Oral Q8H PRN Massengill, Harrold Donath, MD   5 mg at 11/24/21 1358   ondansetron  (ZOFRAN-ODT) disintegrating tablet 8 mg  8 mg Oral Q8H PRN Lamar Sprinkles, MD       Oxcarbazepine (TRILEPTAL) tablet 300 mg  300 mg Oral BID Phineas Inches, MD   300 mg at 11/28/21 0626   thiamine tablet 100 mg  100 mg Oral Daily Melbourne Abts W, PA-C   100 mg at 11/28/21 9485   traZODone (DESYREL) tablet 50 mg  50 mg Oral QHS PRN Phineas Inches, MD   50 mg at 11/27/21 2102   PTA Medications: Medications Prior to Admission  Medication Sig Dispense Refill Last Dose   ibuprofen (ADVIL) 200 MG tablet Take 400 mg by mouth every 6 (six) hours as needed for headache or moderate pain.       Patient Stressors: Neurosurgeon issue   Substance abuse   Other: homeless    Patient Strengths: Physical Health  Supportive family/friends   Treatment Modalities: Medication Management, Group therapy, Case management,  1 to 1 session with clinician, Psychoeducation, Recreational therapy.   Physician Treatment Plan for Primary Diagnosis: Schizoaffective disorder, bipolar type (HCC) Long Term Goal(s): Improvement in symptoms so as ready for discharge   Short Term Goals: Ability to identify changes in lifestyle to reduce recurrence  of condition will improve Ability to verbalize feelings will improve Ability to disclose and discuss suicidal ideas Ability to demonstrate self-control will improve Ability to identify and develop effective coping behaviors will improve Ability to maintain clinical measurements within normal limits will improve Compliance with prescribed medications will improve Ability to identify triggers associated with substance abuse/mental health issues will improve  Medication Management: Evaluate patient's response, side effects, and tolerance of medication regimen.  Therapeutic Interventions: 1 to 1 sessions, Unit Group sessions and Medication administration.  Evaluation of Outcomes: Progressing  Physician Treatment Plan for Secondary Diagnosis:  Principal Problem:   Schizoaffective disorder, bipolar type (HCC)  Long Term Goal(s): Improvement in symptoms so as ready for discharge   Short Term Goals: Ability to identify changes in lifestyle to reduce recurrence of condition will improve Ability to verbalize feelings will improve Ability to disclose and discuss suicidal ideas Ability to demonstrate self-control will improve Ability to identify and develop effective coping behaviors will improve Ability to maintain clinical measurements within normal limits will improve Compliance with prescribed medications will improve Ability to identify triggers associated with substance abuse/mental health issues will improve     Medication Management: Evaluate patient's response, side effects, and tolerance of medication regimen.  Therapeutic Interventions: 1 to 1 sessions, Unit Group sessions and Medication administration.  Evaluation of Outcomes: Progressing   RN Treatment Plan for Primary Diagnosis: Schizoaffective disorder, bipolar type (HCC) Long Term Goal(s): Knowledge of disease and therapeutic regimen to maintain health will improve  Short Term Goals: Ability to participate in decision making will improve, Ability to verbalize feelings will improve, and Ability to identify and develop effective coping behaviors will improve  Medication Management: RN will administer medications as ordered by provider, will assess and evaluate patient's response and provide education to patient for prescribed medication. RN will report any adverse and/or side effects to prescribing provider.  Therapeutic Interventions: 1 on 1 counseling sessions, Psychoeducation, Medication administration, Evaluate responses to treatment, Monitor vital signs and CBGs as ordered, Perform/monitor CIWA, COWS, AIMS and Fall Risk screenings as ordered, Perform wound care treatments as ordered.  Evaluation of Outcomes: Progressing   LCSW Treatment Plan for Primary  Diagnosis: Schizoaffective disorder, bipolar type (HCC) Long Term Goal(s): Safe transition to appropriate next level of care at discharge, Engage patient in therapeutic group addressing interpersonal concerns.  Short Term Goals: Engage patient in aftercare planning with referrals and resources, Increase ability to appropriately verbalize feelings, and Increase emotional regulation  Therapeutic Interventions: Assess for all discharge needs, 1 to 1 time with Social worker, Explore available resources and support systems, Assess for adequacy in community support network, Educate family and significant other(s) on suicide prevention, Complete Psychosocial Assessment, Interpersonal group therapy.  Evaluation of Outcomes: Progressing   Progress in Treatment: Attending groups: Yes. Participating in groups: Yes. Taking medication as prescribed: Yes. Toleration medication: Yes. Family/Significant other contact made: Yes, individual(s) contacted:  mother Patient understands diagnosis: Yes. Discussing patient identified problems/goals with staff: Yes. Medical problems stabilized or resolved: Yes. Denies suicidal/homicidal ideation: Yes. Issues/concerns per patient self-inventory: No. Other: None  New problem(s) identified: No, Describe:  None  New Short Term/Long Term Goal(s):medication stabilization, elimination of SI thoughts, development of comprehensive mental wellness plan.   Patient Goals:  "To get better and to go to groups"   Discharge Plan or Barriers: Pt will f/u with [facility] in [place] for medication management and therapy.   Reason for Continuation of Hospitalization: Medication stabilization  Estimated Length of Stay: 3-5 days  Scribe for Treatment Team: Chrys Racer 11/28/2021 9:20 AM

## 2021-11-28 NOTE — Progress Notes (Signed)
°   11/28/21 0500  Sleep  Number of Hours 3

## 2021-11-28 NOTE — Progress Notes (Signed)
Patient denies SI, HI and AVH this shift.  Patient has been compliant with medications, engaged in groups and has had no incidents of behavioral dyscontrol. Patient transferred to [facility] long ED due to elevated liver enzymes.   Assess patient for safety offer medications as prescribe, engage patient in 1:1 staff talks.  Continue to monitor as planned. Patient able contract for safety.

## 2021-11-28 NOTE — ED Notes (Signed)
[facility] contacted for transport to [facility].

## 2021-11-28 NOTE — Group Note (Signed)
Recreation Therapy Group Note   Group Topic:Problem Solving  Group Date: 11/28/2021 Start Time: 1030 End Time: 1157 Facilitators: Caroll Rancher, LRT,CTRS Location: [address] Dayroom   Goal Area(s) Addresses:  Patient will effectively work in a team with other group members. Patient will verbalize importance of using appropriate problem solving techniques.  Patient will identify positive change associated with effective problem solving skills.   Group Description:  Christmas Trivia.  Patients were broken into groups.  The activity was broken down into four categories (carols, foods, movies/books and traditions/cultures).  Each category also had two rounds.  LRT read off the questions and for each correct answer, team would get the points allotted at the right side of the column.  At the end of each round, teams would talley up their points.  At the completion of the game the team with the most overall total points is the winner.     Affect/Mood: Appropriate   Participation Level: Engaged   Participation Quality: Independent   Behavior: Appropriate   Speech/Thought Process: Focused   Insight: Good   Judgement: Good   Modes of Intervention: Competitive Play   Patient Response to Interventions:  Engaged   Education Outcome:  Acknowledges education and In group clarification offered    Clinical Observations/Individualized Feedback: Pt was very much focused on the activity.  Pt left to meet with doctor.  When pt came back, he asked about the bus schedule for the weekend.  Pt became worried he would be able to get home.  Pt left again to speak with doctor about this.  Pt seemed a little less worried.  Pt left again and did not return.     Plan: Continue to engage patient in RT group sessions 2-3x/week.   Caroll Rancher, LRT,CTRS 11/28/2021 1:03 PM

## 2021-11-28 NOTE — Progress Notes (Signed)
Adult Psychoeducational Group Note  Date:  11/28/2021 Time:  8:54 AM  Group Topic/Focus:  Goals Group:   The focus of this group is to help patients establish daily goals to achieve during treatment and discuss how the patient can incorporate goal setting into their daily lives to aide in recovery.  Participation Level:  Active  Participation Quality:  Appropriate  Affect:  Appropriate  Cognitive:  Appropriate  Insight: Appropriate  Engagement in Group:  Engaged  Modes of Intervention:  Discussion  Additional Comments:  Patient said his goal for today is to be discharged and find his way to [place].   Meribeth Vitug W Taariq Leitz 11/28/2021, 8:54 AM

## 2021-11-28 NOTE — ED Triage Notes (Signed)
Coming from [facility] LFT's elevated-no complaints from patient

## 2021-11-28 NOTE — ED Notes (Signed)
Pt refused vitals. Paramedic Notified

## 2021-11-28 NOTE — Progress Notes (Signed)
Pt brought back from ED earlier, pt stated he was doing ok.     11/28/21 2300  Psych Admission Type (Psych Patients Only)  Admission Status Involuntary  Psychosocial Assessment  Patient Complaints Anxiety  Eye Contact Brief  Facial Expression Anxious  Affect Sad;Preoccupied  Speech Soft  Interaction Cautious  Motor Activity Slow  Appearance/Hygiene Unremarkable  Behavior Characteristics Cooperative  Mood Anxious  Thought Process  Coherency Circumstantial  Content Blaming self  Delusions None reported or observed  Perception WDL  Hallucination None reported or observed  Judgment Poor  Confusion None  Danger to Self  Current suicidal ideation? Denies  Self-Injurious Behavior No self-injurious ideation or behavior indicators observed or expressed   Danger to Others  Danger to Others None reported or observed

## 2021-11-29 LAB — HEPATIC FUNCTION PANEL
ALT: 206 U/L — ABNORMAL HIGH (ref 0–44)
AST: 76 U/L — ABNORMAL HIGH (ref 15–41)
Albumin: 4.1 g/dL (ref 3.5–5.0)
Alkaline Phosphatase: 82 U/L (ref 38–126)
Bilirubin, Direct: 0.1 mg/dL (ref 0.0–0.2)
Indirect Bilirubin: 0.4 mg/dL (ref 0.3–0.9)
Total Bilirubin: 0.5 mg/dL (ref 0.3–1.2)
Total Protein: 7 g/dL (ref 6.5–8.1)

## 2021-11-29 LAB — AMMONIA: Ammonia: 65 umol/L — ABNORMAL HIGH (ref 9–35)

## 2021-11-29 LAB — RPR: RPR Ser Ql: NONREACTIVE

## 2021-11-29 NOTE — Progress Notes (Addendum)
[facility] MD Progress Note  11/29/2021 9:40 AM Maurice [name]  MRN:  [id number] Subjective:    Patient is a 35 year old male with reported psychiatric history of schizophrenia, bipolar disorder, heroin use disorder, cocaine use disorder, alcohol use disorder presents to behavioral health hospital from [facility], ED due to suicidal ideation. On admission, pt was manic and psychotic.   Per nursing, pt is improving and participating more in groups. No behavioral or aggression issues since close obs was d/c. He was sent to the ED yesterday for w/u of elevated LFTs.  Psychiatry team made following recommendations yesterday: -Continue Zyprexa 2.5 mg bid (8:00 and 14:00) and 10 mg at night for psychotic symptoms and mood lability.  - Continue trileptal 300 mg bid - for mood stabilization. We can d/c this, if GI consult recommends this as possible reason for elevated LFTs.  -Continue Gabapentin 300 mg tid for anxiety and alcohol craving -Agitation protocol: haldol, benadryl, ativan -Continue trazodone 50 mg as needed - for insomnia   Patient was seen and assessed with attending Dr. Mason Jim.  Patient appeared sedated this morning.  It took several minutes for patient to set up and speak with this Clinical research associate.  Patient appeared to only exclusively want to speak with this Clinical research associate rather than with attending and this Clinical research associate.  Patient reports that he is doing okay but endorsing suicidal ideation.  Patient denies homicidal ideation and is able to contract for safety while on the unit.  Patient reports he has persistent intrusive thoughts of his nephew who he has not spoken with for several months telling him negative things that is making him have some suicidal ideations.  However, patient reports that he is able to block these out and these thoughts have no control over his behavior.  Patient denies that these are command hallucinations.  Patient denies visual hallucinations, paranoia, ideas of reference, thought  insertion/extraction, first rank symptoms.  Patient still endorsing some mild left shoulder pain but is otherwise doing well.  Patient reports sleeping well, eating well, and regular bowel movements.  Patient denies any GI/GU complaints at this time.    Discussed with patient plan to have patient get a RUQ ultrasound as he did not receive one while he was in the ED yesterday.  Also discussed with patient that we are monitoring his LFTs in order to assess whether he will require another ED visit for further increases in LFT.  Patient verbalized agreement and had no other questions at this time.  Principal Problem: Schizoaffective disorder, bipolar type (HCC) Diagnosis: Principal Problem:   Schizoaffective disorder, bipolar type (HCC)  Total Time Spent in Direct Patient Care:  I personally spent 30 minutes on the unit in direct patient care. The direct patient care time included face-to-face time with the patient, reviewing the patient's chart, communicating with other professionals, and coordinating care. Greater than 50% of this time was spent in counseling or coordinating care with the patient regarding goals of hospitalization, psycho-education, and discharge planning needs.  Past Psychiatric History: See H&P  Past Medical History:  Past Medical History:  Diagnosis Date   Bipolar disorder (HCC)    Manic depressive disorder (HCC)    Medical history non-contributory    Schizophrenia (HCC)     Past Surgical History:  Procedure Laterality Date   NO PAST SURGERIES     Family History: see H&P  Family Psychiatric  History: See H&P   Social History:  Social History   Substance and Sexual Activity  Alcohol Use Yes  Comment: pt states drinks a gallon of liquor and beer mixed daily     Social History   Substance and Sexual Activity  Drug Use Yes   Types: Cocaine    Social History   Socioeconomic History   Marital status: Single    Spouse name: Not on file   Number of  children: Not on file   Years of education: Not on file   Highest education level: Not on file  Occupational History   Not on file  Tobacco Use   Smoking status: Some Days    Types: Cigarettes   Smokeless tobacco: Never  Vaping Use   Vaping Use: Never used  Substance and Sexual Activity   Alcohol use: Yes    Comment: pt states drinks a gallon of liquor and beer mixed daily   Drug use: Yes    Types: Cocaine   Sexual activity: Not Currently  Other Topics Concern   Not on file  Social History Narrative   Not on file   Social Determinants of Health   Financial Resource Strain: Not on file  Food Insecurity: Not on file  Transportation Needs: Not on file  Physical Activity: Not on file  Stress: Not on file  Social Connections: Not on file   Sleep: Good  Appetite:  Good  Current Medications: Current Facility-Administered Medications  Medication Dose Route Frequency Provider Last Rate Last Admin   alum & mag hydroxide-simeth (MAALOX/MYLANTA) 200-200-20 MG/5ML suspension 30 mL  30 mL Oral Q4H PRN Melbourne Abts W, PA-C       haloperidol lactate (HALDOL) injection 5 mg  5 mg Intramuscular Q6H PRN Park Pope, MD       And   LORazepam (ATIVAN) injection 2 mg  2 mg Intramuscular Q6H PRN Park Pope, MD       And   diphenhydrAMINE (BENADRYL) injection 50 mg  50 mg Intramuscular Q6H PRN Park Pope, MD       feeding supplement (BOOST / RESOURCE BREEZE) liquid 1 Container  237 mL Oral BID BM Park Pope, MD   237 mL at 11/27/21 1111   gabapentin (NEURONTIN) capsule 300 mg  300 mg Oral TID Phineas Inches, MD   300 mg at 11/29/21 0935   ibuprofen (ADVIL) tablet 800 mg  800 mg Oral Q8H PRN Park Pope, MD   800 mg at 11/27/21 1933   influenza vac split quadrivalent PF (FLUARIX) injection 0.5 mL  0.5 mL Intramuscular Tomorrow-1000 Mason Jim, Izel Hochberg E, MD       loperamide (IMODIUM) capsule 2 mg  2 mg Oral Q4H PRN Bobbitt, Shalon E, NP   2 mg at 11/28/21 9509   magnesium hydroxide (MILK OF  MAGNESIA) suspension 30 mL  30 mL Oral Daily PRN Melbourne Abts W, PA-C       multivitamin with minerals tablet 1 tablet  1 tablet Oral Daily Melbourne Abts W, PA-C   1 tablet at 11/29/21 0932   OLANZapine (ZYPREXA) tablet 2.5 mg  2.5 mg Oral BID Massengill, Harrold Donath, MD   2.5 mg at 11/29/21 0932   And   OLANZapine (ZYPREXA) tablet 10 mg  10 mg Oral QHS Massengill, Harrold Donath, MD   10 mg at 11/28/21 2100   OLANZapine zydis (ZYPREXA) disintegrating tablet 5 mg  5 mg Oral Q8H PRN Massengill, Harrold Donath, MD   5 mg at 11/24/21 1358   ondansetron (ZOFRAN-ODT) disintegrating tablet 8 mg  8 mg Oral Q8H PRN Lamar Sprinkles, MD       Oxcarbazepine (TRILEPTAL)  tablet 300 mg  300 mg Oral BID Phineas Inches, MD   300 mg at 11/29/21 0932   thiamine tablet 100 mg  100 mg Oral Daily Melbourne Abts W, PA-C   100 mg at 11/29/21 0932   traZODone (DESYREL) tablet 50 mg  50 mg Oral QHS PRN Phineas Inches, MD   50 mg at 11/28/21 2100    Lab Results:  Results for orders placed or performed during the hospital encounter of 11/18/21 (from the past 48 hour(s))  Comprehensive metabolic panel     Status: Abnormal   Collection Time: 11/28/21  6:16 AM  Result Value Ref Range   Sodium 138 135 - 145 mmol/L   Potassium 4.2 3.5 - 5.1 mmol/L   Chloride 105 98 - 111 mmol/L   CO2 25 22 - 32 mmol/L   Glucose, Bld 108 (H) 70 - 99 mg/dL    Comment: Glucose reference range applies only to samples taken after fasting for at least 8 hours.   BUN 22 (H) 6 - 20 mg/dL   Creatinine, Ser 1.61 0.61 - 1.24 mg/dL   Calcium 9.4 8.9 - 09.6 mg/dL   Total Protein 6.9 6.5 - 8.1 g/dL   Albumin 4.0 3.5 - 5.0 g/dL   AST 045 (H) 15 - 41 U/L   ALT 258 (H) 0 - 44 U/L   Alkaline Phosphatase 80 38 - 126 U/L   Total Bilirubin 0.7 0.3 - 1.2 mg/dL   GFR, Estimated >40 >98 mL/min    Comment: (NOTE) Calculated using the CKD-EPI Creatinine Equation (2021)    Anion gap 8 5 - 15    Comment: Performed at [facility], [address]  [place], [place] [address]  CBC     Status: None   Collection Time: 11/28/21  6:16 AM  Result Value Ref Range   WBC 5.3 4.0 - 10.5 K/uL   RBC 4.60 4.22 - 5.81 MIL/uL   Hemoglobin 14.5 13.0 - 17.0 g/dL   HCT 78.2 95.6 - 21.3 %   MCV 93.9 80.0 - 100.0 fL   MCH 31.5 26.0 - 34.0 pg   MCHC 33.6 30.0 - 36.0 g/dL   RDW 08.6 57.8 - 46.9 %   Platelets 164 150 - 400 K/uL   nRBC 0.0 0.0 - 0.2 %    Comment: Performed at [facility], [address] [place], [place] [address]  Valproic acid level     Status: Abnormal   Collection Time: 11/28/21  6:16 AM  Result Value Ref Range   Valproic Acid Lvl <10 (L) 50.0 - 100.0 ug/mL    Comment: Performed at [facility], [address] [place], [place] [address]  Amylase     Status: None   Collection Time: 11/28/21  6:16 AM  Result Value Ref Range   Amylase 96 28 - 100 U/L    Comment: Performed at [facility], [address] [place], [place] [address]  Lipase, blood     Status: None   Collection Time: 11/28/21  6:16 AM  Result Value Ref Range   Lipase 32 11 - 51 U/L    Comment: Performed at [facility], [address], [place], [place] [address]  HIV Antibody (routine testing w rflx)     Status: None   Collection Time: 11/28/21  3:01 PM  Result Value Ref Range   HIV Screen 4th Generation wRfx Non Reactive Non Reactive    Comment: Performed at  [facility], [address] [place], [place] [address]  Acetaminophen level     Status: Abnormal   Collection Time: 11/28/21  3:01 PM  Result Value Ref Range   Acetaminophen (Tylenol), Serum <10 (L) 10 - 30 ug/mL    Comment: (NOTE) Therapeutic concentrations vary significantly. A range of 10-30 ug/mL  may be an effective concentration for many patients. However, some  are best treated at concentrations outside of this range. Acetaminophen concentrations >150 ug/mL at 4 hours after ingestion  and >50 ug/mL at 12 hours after  ingestion are often associated with  toxic reactions.  Performed at [facility], [address], [place], [place] [address]   Salicylate level     Status: Abnormal   Collection Time: 11/28/21  3:01 PM  Result Value Ref Range   Salicylate Lvl <7.0 (L) 7.0 - 30.0 mg/dL    Comment: Performed at [facility], [address] [place], [place] [address]  Rapid HIV screen (HIV 1/2 Ab+Ag)     Status: None   Collection Time: 11/28/21  3:01 PM  Result Value Ref Range   HIV-1 P24 Antigen - HIV24 NON REACTIVE NON REACTIVE    Comment: (NOTE) Detection of p24 may be inhibited by biotin in the sample, causing false negative results in acute infection.    HIV 1/2 Antibodies NON REACTIVE NON REACTIVE   Interpretation (HIV Ag Ab)      A non reactive test result means that HIV 1 or HIV 2 antibodies and HIV 1 p24 antigen were not detected in the specimen.    Comment: RESULT CALLED TO, READ BACK BY AND VERIFIED WITH: S.WEST, RN AT 1613 ON 12.23.22 BY N.THOMPSON Performed at [facility], [address], [place], [place] [address]   Protime-INR     Status: None   Collection Time: 11/28/21  5:43 PM  Result Value Ref Range   Prothrombin Time 11.7 11.4 - 15.2 seconds   INR 0.9 0.8 - 1.2    Comment: (NOTE) INR goal varies based on device and disease states. Performed at [facility], [address] [place], [place] [address]      Blood Alcohol level:  Lab Results  Component Value Date   ETH <10 11/17/2021    Metabolic Disorder Labs: Lab Results  Component Value Date   HGBA1C 4.8 11/19/2021   MPG 91.06 11/19/2021   No results found for: PROLACTIN Lab Results  Component Value Date   CHOL 138 11/19/2021   TRIG 249 (H) 11/19/2021   HDL 43 11/19/2021   CHOLHDL 3.2 11/19/2021   VLDL 50 (H) 11/19/2021   LDLCALC 45 11/19/2021    Physical Findings: CIWA:  CIWA-Ar Total: 0  Musculoskeletal: Strength & Muscle  Tone: within normal limits Gait & Station: untested in bed Patient leans: N/A  Psychiatric Specialty Exam:  Presentation  General Appearance: appropriate, appears sleepy, barely able to keep eyes open, casually dressed  Eye Contact: minimal  Speech:Clear and Coherent; Normal Rate  Speech Volume:Normal  Handedness:Right   Mood and Affect  Mood: "okay" - appears aloof  Affect:Restricted; Congruent   Thought Process  Thought Processes:Linear; Coherent  Orientation:Full (Time, Place and Person)  Thought Content: Denies HI/VH. Does not appear to be responding to internal stimuli.  Endorses passive SI but is able to contract for safety while on the unit.  Endorses intrusive thoughts and AH from nephew   History of Schizophrenia/Schizoaffective disorder:Yes  Duration of Psychotic Symptoms:Greater  than six months  Hallucinations:AH of nephew inside his head  Ideas of Reference: denies  Suicidal Thoughts:denies  Homicidal Thoughts:denies    Sensorium  Memory:Immediate Good; Recent Good; Remote Good  Judgment:improving  Insight:improving    Executive Functions  Concentration:Fair  Attention Span:Fair  Recall:Fair  Fund of Knowledge:Poor  Language:Good   Psychomotor Activity  Psychomotor Activity:Psychomotor Activity: Normal   Assets  Assets:Resilience; Communication Skills   Sleep  7.75 hours   Physical Exam Vitals reviewed.  Constitutional:      General: He is not in acute distress.    Appearance: He is normal weight.  Pulmonary:     Effort: Pulmonary effort is normal.  Neurological:     Mental Status: He is alert.     Motor: No weakness.     Gait: Gait normal.   Review of Systems  Constitutional:  Negative for chills and fever.  Cardiovascular:  Negative for chest pain and palpitations.  Gastrointestinal:  Negative for abdominal pain, constipation, diarrhea, nausea and vomiting.  Psychiatric/Behavioral:  Positive for depression.  Negative for hallucinations, substance abuse and suicidal ideas. The patient is nervous/anxious. The patient does not have insomnia.   All other systems reviewed and are negative.  Blood pressure 120/78, pulse 94, temperature 97.6 F (36.4 C), temperature source Oral, resp. rate 14, height 5' 7.5" (1.715 m), weight 75.8 kg, SpO2 99 %. Body mass index is 25.77 kg/m.   Treatment Plan Summary: Daily contact with patient to assess and evaluate symptoms and progress in treatment and Medication management   PLAN Safety and Monitoring: Involuntary admission to inpatient psychiatric unit for safety, stabilization and treatment Daily contact with patient to assess and evaluate symptoms and progress in treatment Patient's case to be discussed in multi-disciplinary team meeting Observation Level : q15 minute checks Vital signs: q12 hours Precautions: suicide, elopement, and assault     Psychiatric Problems Schizoaffective d/o bipolar type Alcohol Use Disorder-Severe -Continue Zyprexa 2.5 mg bid (8:00 and 14:00) and 10 mg at night for psychotic symptoms and mood lability.  - Continue trileptal 300 mg bid - for mood stabilization.  -Continue Gabapentin 300 mg tid for anxiety and alcohol craving -Agitation protocol: haldol, benadryl, ativan -Continue trazodone 50 mg as needed - for insomnia    Medical Problems  Elevated AST/ALT -AST 111, ALT 258 -PT-INR wnl, amylase 96, lipase 32 -Repeat hepatic function panel ordered for this evening - Hepatitis panel ordered  -  NH3 ordered - RUQ u/s ordered -Per Dr. Bosie Clos, should patient have further elevation in AST/ALT, patient will require RUQ ultrasound and ED re-evaluation. Otherwise, if patient continues to be asymptomatic and labs are downtrending, outpatient PCP follow up is appropriate.   STD Screening -HIV nonreactive -RPR nonreactive -GC/Chlamydia pending -Hepatitis panel pending  Left Shoulder Pain -Left shoulder CXR: There is no  evidence of fracture or dislocation. There is no evidence of arthropathy or other focal bone abnormality. Soft tissues are unremarkable. -Tylenol 1000 mg bid for pain -Ibuprofen 800 tid prn for breakthrough pain   PRNs -Maalox/Mylanta 30 mL for indigestion -Hydroxyzine 25 mg tid for anxiety -Milk of Magnesia 30 mL for constipation -Zofran for nausea/vomiting   4. Discharge Planning: Social work and case management to assist with discharge planning and identification of hospital follow-up needs prior to discharge Estimated LOS: 5-7 days Discharge Concerns: Need to establish a safety plan; Medication compliance and effectiveness Discharge Goals: Return home with outpatient referrals for mental health follow-up including medication management/psychotherapy   Park Pope, MD  11/29/2021, 9:40 AM

## 2021-11-29 NOTE — Progress Notes (Signed)
°   11/29/21 2035  Psych Admission Type (Psych Patients Only)  Admission Status Involuntary  Psychosocial Assessment  Patient Complaints Anxiety  Eye Contact Brief  Facial Expression Animated;Anxious  Affect Anxious;Preoccupied  Speech Soft;Logical/coherent  Interaction Assertive;Minimal;Guarded  Motor Activity Slow  Appearance/Hygiene Unremarkable  Behavior Characteristics Cooperative  Mood Anxious;Pleasant;Euthymic  Thought Process  Coherency Circumstantial  Content Blaming self  Delusions None reported or observed  Perception WDL  Hallucination None reported or observed  Judgment Poor  Confusion None  Danger to Self  Current suicidal ideation? Denies  Self-Injurious Behavior No self-injurious ideation or behavior indicators observed or expressed   Agreement Not to Harm Self Yes  Description of Agreement Verbal contract  Danger to Others  Danger to Others None reported or observed  Danger to Others Abnormal  Harmful Behavior to others No threats or harm toward other people

## 2021-11-29 NOTE — Progress Notes (Signed)
Adult Psychoeducational Group Note  Date:  11/29/2021 Time:  11:40 PM  Group Topic/Focus:  Wrap-Up Group:   The focus of this group is to help patients review their daily goal of treatment and discuss progress on daily workbooks.  Participation Level:  Active  Participation Quality:  Appropriate  Affect:  Appropriate  Cognitive:  Appropriate  Insight: Appropriate  Engagement in Group:  Engaged  Modes of Intervention:  Discussion  Additional Comments:  Pt stated his goal for today was to focus on his treatment plan. Pt stated he accomplished his goal today. Pt stated he talked with his doctor and social worker about his care today. Pt rated his overall day a 10. Pt stated he was able to contact his sister today which improved his overall day. Pt stated he felt better about himself today. Pt stated he attend all meals today. Pt stated he took all medications provided today. Pt stated he attend all groups held today. Pt stated his appetite was pretty good today. Pt rated sleep last night was good. Pt stated the goal tonight was to get some rest. Pt stated he had some physical pain tonight.Pt stated he had some moderate pain in his left shoulder. Pt rated the moderate pain in his left shoulder a 6 on the pain level scale. Pt nurse was updated on the situation. Pt deny visual hallucinations and auditory issues tonight. Pt denies thoughts of harming himself or others. Pt stated he would alert staff if anything changed.  Felipa Furnace 11/29/2021, 11:40 PM

## 2021-11-29 NOTE — Progress Notes (Signed)
°   11/29/21 1300  Psych Admission Type (Psych Patients Only)  Admission Status Involuntary  Psychosocial Assessment  Patient Complaints Anxiety  Eye Contact Brief  Facial Expression Anxious  Affect Sad;Preoccupied  Speech Soft  Interaction Cautious  Motor Activity Slow  Appearance/Hygiene Unremarkable  Behavior Characteristics Cooperative  Mood Anxious;Pleasant  Aggressive Behavior  Effect No apparent injury  Thought Process  Coherency Circumstantial  Content Blaming self  Delusions None reported or observed  Perception WDL  Hallucination None reported or observed  Judgment Poor  Confusion None  Danger to Self  Current suicidal ideation? Denies  Self-Injurious Behavior No self-injurious ideation or behavior indicators observed or expressed   Agreement Not to Harm Self Yes  Description of Agreement Verbal Contract  Danger to Others  Danger to Others None reported or observed  Danger to Others Abnormal  Harmful Behavior to others No threats or harm toward other people  Destructive Behavior No threats or harm toward property

## 2021-11-29 NOTE — Progress Notes (Signed)
°   11/29/21 0500  Sleep  Number of Hours 7.75

## 2021-11-29 NOTE — Group Note (Signed)
[facility] LCSW Group Therapy Note  11/29/2021  10:00-11:00AM  Type of Therapy and Topic:  Group Therapy:  Acknowledging and Resolving Issues about the Holidays  Participation Level:  Did Not Attend   Description of Group:   Patients in this group were asked to briefly describe their experiences with Christmas/winter holidays in their lives, both in childhood and adulthood.  Different types of support provided by the influencing individuals in their lives were identified.   Patients were then encouraged to determine whether their influencing figures were/are healthy or unhealthy for them.  The manner in which those early experiences have shaped patient's feelings and actions on an ongoing basis was pointed out and acknowledged.  Group members gave support to each other.  CSW led a discussion on how helpful it can be to resolve past issues, and how this can be done whether the influencing figures are now alive or already deceased.  An emphasis was placed on continuing to work with a therapist on these issues  when patients leave the hospital in order to be able to focus on the future instead of the past, to continue becoming healthier and happier.   Therapeutic Goals: 1)  discuss the possibility of influencing figure(s) being positive and/or negative in one's life, normalizing that some people never had positive experiences with "support" persons  2)  describe patient's specific example of their typical holiday experiences, allowing time to vent  3)  identify the patient's current need to decide how they now want to experience the holidays, by choice instead of only following the traditions of the past  4)  elicit commitments to work on resolving ambivalent feelings about the holidays in order to move forward in life and wellness   Summary of Patient Progress:  The patient did not attend.   Therapeutic Modalities:   Processing Brief Solution-Focused Therapy  Lynnell Chad

## 2021-11-30 ENCOUNTER — Ambulatory Visit (HOSPITAL_COMMUNITY): Payer: Medicare HMO

## 2021-11-30 DIAGNOSIS — E7220 Disorder of urea cycle metabolism, unspecified: Secondary | ICD-10-CM

## 2021-11-30 DIAGNOSIS — R7401 Elevation of levels of liver transaminase levels: Secondary | ICD-10-CM

## 2021-11-30 LAB — HEPATITIS PANEL, ACUTE
HCV Ab: NONREACTIVE
Hep A IgM: NONREACTIVE
Hep B C IgM: NONREACTIVE
Hepatitis B Surface Ag: NONREACTIVE

## 2021-11-30 MED ORDER — LACTULOSE 10 GM/15ML PO SOLN
10.0000 g | Freq: Every day | ORAL | Status: AC
  Administered 2021-11-30: 09:00:00 10 g via ORAL
  Filled 2021-11-30 (×2): qty 15

## 2021-11-30 MED ORDER — LACTULOSE 10 GM/15ML PO SOLN: 10.0000 g | Freq: Two times a day (BID) | ORAL | Status: DC

## 2021-11-30 MED ORDER — LACTULOSE 10 GM/15ML PO SOLN
10.0000 g | Freq: Two times a day (BID) | ORAL | Status: AC
  Administered 2021-11-30 – 2021-12-01 (×3): 10 g via ORAL
  Filled 2021-11-30 (×4): qty 15

## 2021-11-30 NOTE — [facility] Group Notes (Signed)
[facility] Group Notes: (Clinical Social Work)   11/30/2021      Type of Therapy:  Group Therapy   Participation Level:  Did Not Attend - was invited both individually by [facility]HT and by overhead announcement, chose not to attend.   Ambrose Mantle, LCSW 11/30/2021, 11:55 AM

## 2021-11-30 NOTE — Plan of Care (Signed)
°  Problem: Role Relationship: Goal: Ability to interact with others will improve Outcome: Progressing   Problem: Safety: Goal: Ability to remain free from injury will improve Outcome: Progressing

## 2021-11-30 NOTE — Progress Notes (Addendum)
[facility] MD Progress Note  11/30/2021 2:57 PM Maurice [name]  MRN:  [id number] Subjective:    Patient is a 35 year old male with reported psychiatric history of schizophrenia, bipolar disorder, heroin use disorder, cocaine use disorder, alcohol use disorder presents to behavioral health hospital from [facility], ED due to suicidal ideation. On admission, pt was manic and psychotic.   Per nursing, pt is improving and participating in some groups. No behavioral issues noted. Per [facility] he was compliant with scheduled medications and only required PRN Trazodone for insomnia.  Psychiatry team made following recommendations yesterday: -Continue Zyprexa 2.5 mg bid (8:00 and 14:00) and 10 mg at night for psychotic symptoms and mood lability.  - Continue trileptal 300 mg bid - for mood stabilization.  -Continue Gabapentin 300 mg tid for anxiety and alcohol craving -Agitation protocol: haldol, benadryl, ativan -Continue trazodone 50 mg as needed - for insomnia  - RUQ u/s ordered along with repeat LFT, hepatitis panel and NH3 level  Patient was seen and assessed with attending Dr. Mason Jim.  Patient appeared sedated this morning but appears less sedated than yesterday.  Patient was much more open to speaking with this Clinical research associate as well as attending.  Patient reports that he is doing okay and denying suicidal ideation.  Patient denies homicidal ideation and is able to contract for safety while on the unit.  Patient reports that he has significantly less persistent and intrusive thoughts about nephew or about family.  Patient denies auditory and visual hallucinations today.  Patient denies paranoia, ideas of reference, thought insertion/extraction, first rank symptoms.  Patient states he is eating appropriately and sleeping appropriately.  Patient did report mild abdominal pain when RUQ ultrasound was being performed but otherwise denies any other GI complaints.  Patient still endorsing some mild left shoulder pain but  reports that it is improving.  Patient reports sleeping well, eating well, and regular bowel movements.  Patient denies any GI/GU complaints at this time.    Discussed plan with patient to likely discharge early next week once hepatitis panel and follow-up are more carefully established.  Discussed that his liver enzymes have been downtrending.  Discussed his hepatitis panel still pending.  Discussed we would start lactulose on patient in order to reduce ammonia level given patient's increased somnolence.  Patient verbalized agreement with plan and had no other questions at this time.  Principal Problem: Schizoaffective disorder, bipolar type (HCC) Diagnosis: Principal Problem:   Schizoaffective disorder, bipolar type (HCC) Active Problems:   Transaminitis   Hyperammonemia (HCC)  Total Time Spent in Direct Patient Care:  I personally spent 30 minutes on the unit in direct patient care. The direct patient care time included face-to-face time with the patient, reviewing the patient's chart, communicating with other professionals, and coordinating care. Greater than 50% of this time was spent in counseling or coordinating care with the patient regarding goals of hospitalization, psycho-education, and discharge planning needs.  Past Psychiatric History: See H&P  Past Medical History:  Past Medical History:  Diagnosis Date   Bipolar disorder (HCC)    Manic depressive disorder (HCC)    Medical history non-contributory    Schizophrenia (HCC)     Past Surgical History:  Procedure Laterality Date   NO PAST SURGERIES     Family History: see H&P  Family Psychiatric  History: See H&P   Social History:  Social History   Substance and Sexual Activity  Alcohol Use Yes   Comment: pt states drinks a gallon of liquor and beer  mixed daily     Social History   Substance and Sexual Activity  Drug Use Yes   Types: Cocaine    Social History   Socioeconomic History   Marital status: Single     Spouse name: Not on file   Number of children: Not on file   Years of education: Not on file   Highest education level: Not on file  Occupational History   Not on file  Tobacco Use   Smoking status: Some Days    Types: Cigarettes   Smokeless tobacco: Never  Vaping Use   Vaping Use: Never used  Substance and Sexual Activity   Alcohol use: Yes    Comment: pt states drinks a gallon of liquor and beer mixed daily   Drug use: Yes    Types: Cocaine   Sexual activity: Not Currently  Other Topics Concern   Not on file  Social History Narrative   Not on file   Social Determinants of Health   Financial Resource Strain: Not on file  Food Insecurity: Not on file  Transportation Needs: Not on file  Physical Activity: Not on file  Stress: Not on file  Social Connections: Not on file   Sleep: Good  Appetite:  Good  Current Medications: Current Facility-Administered Medications  Medication Dose Route Frequency Provider Last Rate Last Admin   alum & mag hydroxide-simeth (MAALOX/MYLANTA) 200-200-20 MG/5ML suspension 30 mL  30 mL Oral Q4H PRN Melbourne Abts W, PA-C       haloperidol lactate (HALDOL) injection 5 mg  5 mg Intramuscular Q6H PRN Park Pope, MD       And   LORazepam (ATIVAN) injection 2 mg  2 mg Intramuscular Q6H PRN Park Pope, MD       And   diphenhydrAMINE (BENADRYL) injection 50 mg  50 mg Intramuscular Q6H PRN Park Pope, MD       feeding supplement (BOOST / RESOURCE BREEZE) liquid 1 Container  237 mL Oral BID BM Park Pope, MD   1 Container at 11/30/21 4081   gabapentin (NEURONTIN) capsule 300 mg  300 mg Oral TID Phineas Inches, MD   300 mg at 11/30/21 1406   ibuprofen (ADVIL) tablet 800 mg  800 mg Oral Q8H PRN Park Pope, MD   800 mg at 11/30/21 4481   influenza vac split quadrivalent PF (FLUARIX) injection 0.5 mL  0.5 mL Intramuscular Tomorrow-1000 Mason Jim, Dondrell Loudermilk E, MD       lactulose (CHRONULAC) 10 GM/15ML solution 10 g  10 g Oral BID Park Pope, MD        loperamide (IMODIUM) capsule 2 mg  2 mg Oral Q4H PRN Bobbitt, Shalon E, NP   2 mg at 11/28/21 8563   magnesium hydroxide (MILK OF MAGNESIA) suspension 30 mL  30 mL Oral Daily PRN Melbourne Abts W, PA-C       multivitamin with minerals tablet 1 tablet  1 tablet Oral Daily Melbourne Abts W, PA-C   1 tablet at 11/30/21 0917   OLANZapine (ZYPREXA) tablet 2.5 mg  2.5 mg Oral BID Massengill, Harrold Donath, MD   2.5 mg at 11/30/21 1406   And   OLANZapine (ZYPREXA) tablet 10 mg  10 mg Oral QHS Massengill, Harrold Donath, MD   10 mg at 11/29/21 2035   OLANZapine zydis (ZYPREXA) disintegrating tablet 5 mg  5 mg Oral Q8H PRN Massengill, Harrold Donath, MD   5 mg at 11/24/21 1358   ondansetron (ZOFRAN-ODT) disintegrating tablet 8 mg  8 mg Oral Q8H PRN  Lamar Sprinkles, MD       Oxcarbazepine (TRILEPTAL) tablet 300 mg  300 mg Oral BID Phineas Inches, MD   300 mg at 11/30/21 1610   thiamine tablet 100 mg  100 mg Oral Daily Melbourne Abts W, PA-C   100 mg at 11/30/21 9604   traZODone (DESYREL) tablet 50 mg  50 mg Oral QHS PRN Phineas Inches, MD   50 mg at 11/29/21 2035    Lab Results:  Results for orders placed or performed during the hospital encounter of 11/18/21 (from the past 48 hour(s))  RPR     Status: None   Collection Time: 11/28/21  3:01 PM  Result Value Ref Range   RPR Ser Ql NON REACTIVE NON REACTIVE    Comment: Performed at [facility], [address], [place], [place] [address]  HIV Antibody (routine testing w rflx)     Status: None   Collection Time: 11/28/21  3:01 PM  Result Value Ref Range   HIV Screen 4th Generation wRfx Non Reactive Non Reactive    Comment: Performed at [facility], [address], [place], [place] [address]  Acetaminophen level     Status: Abnormal   Collection Time: 11/28/21  3:01 PM  Result Value Ref Range   Acetaminophen (Tylenol), Serum <10 (L) 10 - 30 ug/mL    Comment: (NOTE) Therapeutic concentrations vary significantly. A range of 10-30 ug/mL  may be an effective  concentration for many patients. However, some  are best treated at concentrations outside of this range. Acetaminophen concentrations >150 ug/mL at 4 hours after ingestion  and >50 ug/mL at 12 hours after ingestion are often associated with  toxic reactions.  Performed at [facility], [address] [place], [place] [address]   Salicylate level     Status: Abnormal   Collection Time: 11/28/21  3:01 PM  Result Value Ref Range   Salicylate Lvl <7.0 (L) 7.0 - 30.0 mg/dL    Comment: Performed at [facility], [address], [place], [place] [address]  Rapid HIV screen (HIV 1/2 Ab+Ag)     Status: None   Collection Time: 11/28/21  3:01 PM  Result Value Ref Range   HIV-1 P24 Antigen - HIV24 NON REACTIVE NON REACTIVE    Comment: (NOTE) Detection of p24 may be inhibited by biotin in the sample, causing false negative results in acute infection.    HIV 1/2 Antibodies NON REACTIVE NON REACTIVE   Interpretation (HIV Ag Ab)      A non reactive test result means that HIV 1 or HIV 2 antibodies and HIV 1 p24 antigen were not detected in the specimen.    Comment: RESULT CALLED TO, READ BACK BY AND VERIFIED WITH: S.WEST, RN AT 1613 ON 12.23.22 BY N.THOMPSON Performed at [facility], [address], [place], [place] [address]   Protime-INR     Status: None   Collection Time: 11/28/21  5:43 PM  Result Value Ref Range   Prothrombin Time 11.7 11.4 - 15.2 seconds   INR 0.9 0.8 - 1.2    Comment: (NOTE) INR goal varies based on device and disease states. Performed at [facility], [address] [place], [place] [address]   Hepatitis panel, acute     Status: None   Collection Time: 11/29/21  6:36 PM  Result Value Ref Range   Hepatitis B Surface Ag NON REACTIVE NON REACTIVE   HCV Ab NON REACTIVE  NON REACTIVE    Comment: (NOTE) Nonreactive HCV antibody screen is consistent with no HCV infections,  unless recent  infection is suspected or other evidence exists to indicate HCV infection.     Hep A IgM NON REACTIVE NON REACTIVE   Hep B C IgM NON REACTIVE NON REACTIVE    Comment: Performed at [facility], [address], [place], [place] [address]  Hepatic function panel     Status: Abnormal   Collection Time: 11/29/21  6:36 PM  Result Value Ref Range   Total Protein 7.0 6.5 - 8.1 g/dL   Albumin 4.1 3.5 - 5.0 g/dL   AST 76 (H) 15 - 41 U/L   ALT 206 (H) 0 - 44 U/L   Alkaline Phosphatase 82 38 - 126 U/L   Total Bilirubin 0.5 0.3 - 1.2 mg/dL   Bilirubin, Direct 0.1 0.0 - 0.2 mg/dL   Indirect Bilirubin 0.4 0.3 - 0.9 mg/dL    Comment: Performed at [facility], [address], [place], [place] [address]  Ammonia     Status: Abnormal   Collection Time: 11/29/21  6:36 PM  Result Value Ref Range   Ammonia 65 (H) 9 - 35 umol/L    Comment: Performed at [facility], [address], [place], [place] [address]     Blood Alcohol level:  Lab Results  Component Value Date   ETH <10 11/17/2021    Metabolic Disorder Labs: Lab Results  Component Value Date   HGBA1C 4.8 11/19/2021   MPG 91.06 11/19/2021   No results found for: PROLACTIN Lab Results  Component Value Date   CHOL 138 11/19/2021   TRIG 249 (H) 11/19/2021   HDL 43 11/19/2021   CHOLHDL 3.2 11/19/2021   VLDL 50 (H) 11/19/2021   LDLCALC 45 11/19/2021    Physical Findings: CIWA:  CIWA-Ar Total: 0  Musculoskeletal: Strength & Muscle Tone: within normal limits Gait & Station: untested in bed Patient leans: N/A  Psychiatric Specialty Exam:  Presentation  General Appearance: appropriate, appears sleepy, barely able to keep eyes open, casually dressed  Eye Contact: minimal  Speech:Clear and Coherent; Normal Rate  Speech Volume:Normal  Handedness:Right   Mood and Affect  Mood: "fine" - appears calm and more euthymic  Affect:constricted   Thought Process  Thought  Processes:Linear; Coherent  Orientation:Full (Time, Place and Person)  Thought Content: Denies SI/HI/VH. Does not appear to be responding to internal stimuli. Denies intrusive thoughts  Hallucinations: denies  Ideas of Reference: denies  Suicidal Thoughts:denies  Homicidal Thoughts:denies    Sensorium  Memory:Immediate Good; Recent Good; Remote Good  Judgment:improving  Insight:improving    Executive Functions  Concentration:Fair  Attention Span:Fair  Recall:Fair  Fund of Knowledge:Fair  Language:Good   Psychomotor Activity  Psychomotor Activity:decreased - sedated appearing; AIMS 0, no cogwheeling, no stiffness, no tremor    Assets  Assets:Resilience; Communication Skills   Sleep  Hours not recorded   Physical Exam Vitals reviewed.  Constitutional:      General: He is not in acute distress.    Appearance: He is normal weight.  Pulmonary:     Effort: Pulmonary effort is normal.  Neurological:     Mental Status: He is alert.     Motor: No weakness.     Gait: Gait normal.   Review of Systems  Constitutional:  Negative for chills and fever.  Cardiovascular:  Negative for chest pain and palpitations.  Gastrointestinal:  Negative for abdominal pain, constipation, diarrhea,  nausea and vomiting.  All other systems reviewed and are negative.  Blood pressure 104/73, pulse 94, temperature (!) 97.2 F (36.2 C), temperature source Oral, resp. rate 14, height 5' 7.5" (1.715 m), weight 75.8 kg, SpO2 98 %. Body mass index is 25.77 kg/m.   Treatment Plan Summary: Daily contact with patient to assess and evaluate symptoms and progress in treatment and Medication management   PLAN Safety and Monitoring: Involuntary admission to inpatient psychiatric unit for safety, stabilization and treatment Daily contact with patient to assess and evaluate symptoms and progress in treatment Patient's case to be discussed in multi-disciplinary team meeting Observation  Level : q15 minute checks Vital signs: q12 hours Precautions: suicide, elopement, and assault     Psychiatric Problems Schizoaffective d/o bipolar type Alcohol Use Disorder-Severe -Continue Zyprexa 2.5 mg bid (8:00 and 14:00) and 10 mg at night for psychotic symptoms and mood lability.  - Continue trileptal 300 mg bid - for mood stabilization.  -Continue Gabapentin 300 mg tid for anxiety and alcohol craving -Agitation protocol: haldol, benadryl, ativan -Continue trazodone 50 mg as needed - for insomnia    Medical Problems  Elevated AST/ALT and hyperammonemia -AST 111 > 76, ALT 258 > 206 -PT-INR wnl, amylase 96, lipase 32 -Hepatitis panel nonreactive -Ammonia level elevated 65 -START Lactulose 10 mg BID for possible hepatic encephalopathy  -RUQ u/s showed no focal lesions and WNL parenchymal echogenicity in the liver - normal RUQ u/s -Per Dr. Bosie Clos, should patient have further elevation in AST/ALT, patient will require RUQ ultrasound and ED re-evaluation. Otherwise, if patient continues to be asymptomatic and labs are downtrending, outpatient PCP follow up is appropriate.   STD Screening -HIV nonreactive -RPR nonreactive -GC/Chlamydia pending -Hepatitis panel nonreactive  Left Shoulder Pain -Left shoulder CXR: There is no evidence of fracture or dislocation. There is no evidence of arthropathy or other focal bone abnormality. Soft tissues are unremarkable. -Tylenol 1000 mg bid for pain -Ibuprofen 800 tid prn for breakthrough pain   PRNs -Maalox/Mylanta 30 mL for indigestion -Hydroxyzine 25 mg tid for anxiety -Milk of Magnesia 30 mL for constipation -Zofran for nausea/vomiting   4. Discharge Planning: Social work and case management to assist with discharge planning and identification of hospital follow-up needs prior to discharge Estimated LOS: 7-14 days Discharge Concerns: Need to establish a safety plan; Medication compliance and effectiveness Discharge Goals: Return  home with outpatient referrals for mental health follow-up including medication management/psychotherapy  Park Pope, MD PGY1 11/30/2021, 2:57 PM

## 2021-11-30 NOTE — Progress Notes (Signed)
Progress note    11/30/21 0810  Psych Admission Type (Psych Patients Only)  Admission Status Involuntary  Psychosocial Assessment  Patient Complaints Anxiety  Eye Contact Brief  Facial Expression Anxious;Pensive  Affect Anxious;Preoccupied  Speech Logical/coherent  Interaction Assertive  Motor Activity Slow  Appearance/Hygiene Unremarkable  Behavior Characteristics Cooperative;Appropriate to situation;Anxious  Mood Anxious;Pleasant  Thought Process  Coherency Circumstantial  Content Blaming self  Delusions None reported or observed  Perception WDL  Hallucination None reported or observed  Judgment Poor  Confusion None  Danger to Self  Current suicidal ideation? Denies  Danger to Others  Danger to Others None reported or observed

## 2021-12-01 NOTE — Group Note (Signed)
LCSW Group Therapy Note   Group Date: 12/01/2021 Start Time: 1300 End Time: 1330  LCSW Aftercare Discharge Planning Group Note  12/01/2021   Type of Group and Topic: Psychoeducational Group: Discharge Planning  Participation Level: Active  Description of Group  Discharge planning group reviews patients anticipated discharge plans and assists patients to anticipate and address any barriers to wellness/recovery in the community. Suicide prevention education is reviewed with patients in group.  Therapeutic Goals  1. Patients will state their anticipated discharge plan and mental health aftercare  2. Patients will identify potential barriers to wellness in the community setting  3. Patients will engage in problem solving, solution focused discussion of ways to anticipate and address barriers to wellness/recovery  Summary of Patient Progress: CSW completed 1:1 discharge planning with pt. Pt was able to ask CSW questions during this time.   Plan for Discharge/Comments: Pt will go to a shelter in [place] and follow up at [facility] in [place] for medication management and therapy.     Transportation Means: Safe Transport  Supports: Friends  Therapeutic Modalities: Motivational Interviewing  Maurice [name], Theresia Majors 12/01/2021  1:09 PM

## 2021-12-01 NOTE — Group Note (Signed)
Recreation Therapy Group Note   Group Topic:Leisure Education  Group Date: 12/01/2021 Start Time: 1000 End Time: 1035 Facilitators: Caroll Rancher, LRT,CTRS Location: [address] Dayroom   Goal Area(s) Addresses:  Patient will identify positive leisure and recreation activities.  Patient will identify one positive benefit of participation in leisure activities.   Group Description:   Keep It Going Volleyball.  Patients were placed in a circle.  Patients were given a beach ball, which they were to pass or bounce to each without the ball coming to a complete stop.  LRT would time the group to see how long they could keep the ball in motion.  If at any point the ball came to a stop, the time would start over.   Affect/Mood: N/A   Participation Level: Did not attend    Clinical Observations/Individualized Feedback:     Plan: Continue to engage patient in RT group sessions 2-3x/week.   Caroll Rancher, Antonietta Jewel  12/01/2021 12:42 PM

## 2021-12-01 NOTE — Progress Notes (Signed)
Pt denied SI/HI/AVH.  Pt took medications without incident.  Pt was respectful to staff throughout the shift.  Pt presented with blunted affect and expressed some fear about discharging from [facility] facility.

## 2021-12-01 NOTE — Progress Notes (Addendum)
[facility] MD Progress Note  12/01/2021 10:38 AM Maurice [name]  MRN:  [id number] Subjective:    Patient is a 35 year old male with reported psychiatric history of schizophrenia, bipolar disorder, heroin use disorder, cocaine use disorder, alcohol use disorder presents to behavioral health hospital from [facility], ED due to suicidal ideation. On admission, pt was manic and psychotic.   Per nursing, pt is improving and participating in some groups. No behavioral issues noted. Per [facility] he was compliant with scheduled medications and only required PRN Trazodone for insomnia.  Psychiatry team made following recommendations yesterday: -Continue Zyprexa 2.5 mg bid (8:00 and 14:00) and 10 mg at night for psychotic symptoms and mood lability.  - Continue trileptal 300 mg bid - for mood stabilization.  -Continue Gabapentin 300 mg tid for anxiety and alcohol craving -Agitation protocol: haldol, benadryl, ativan -Continue trazodone 50 mg as needed - for insomnia  - RUQ u/s ordered along with repeat LFT, hepatitis panel and NH3 level  Patient was seen and assessed with attending Dr. Gilmore Laroche.  Patient appeared significantly less sedated this morning.  Patient reports doing well.  Patient denying auditory/visual hallucination.  Patient denying paranoia, first rank symptoms, ideas of reference, thought insertion/extraction.  Patient reports that he has been sleeping and eating well.  Patient denies any somatic complaints at this time and reports regular bowel movements last one being this morning.  Discussed plan for patient to discharge tomorrow.  Patient agreeable to this plan.  Discussed with patient his elevated liver enzymes most likely a combination of medication but also his alcohol and substance abuse.  Reiterated importance to abstain from further alcohol and substance use once discharged.  Patient verbalized understanding and agreement with plan and had no other questions at this time.  Principal Problem:  Schizoaffective disorder, bipolar type (HCC) Diagnosis: Principal Problem:   Schizoaffective disorder, bipolar type (HCC) Active Problems:   Transaminitis   Hyperammonemia (HCC)  Total Time Spent in Direct Patient Care:  I personally spent 30 minutes on the unit in direct patient care. The direct patient care time included face-to-face time with the patient, reviewing the patient's chart, communicating with other professionals, and coordinating care. Greater than 50% of this time was spent in counseling or coordinating care with the patient regarding goals of hospitalization, psycho-education, and discharge planning needs.  Past Psychiatric History: See H&P  Past Medical History:  Past Medical History:  Diagnosis Date   Bipolar disorder (HCC)    Manic depressive disorder (HCC)    Medical history non-contributory    Schizophrenia (HCC)     Past Surgical History:  Procedure Laterality Date   NO PAST SURGERIES     Family History: see H&P  Family Psychiatric  History: See H&P   Social History:  Social History   Substance and Sexual Activity  Alcohol Use Yes   Comment: pt states drinks a gallon of liquor and beer mixed daily     Social History   Substance and Sexual Activity  Drug Use Yes   Types: Cocaine    Social History   Socioeconomic History   Marital status: Single    Spouse name: Not on file   Number of children: Not on file   Years of education: Not on file   Highest education level: Not on file  Occupational History   Not on file  Tobacco Use   Smoking status: Some Days    Types: Cigarettes   Smokeless tobacco: Never  Vaping Use   Vaping Use: Never  used  Substance and Sexual Activity   Alcohol use: Yes    Comment: pt states drinks a gallon of liquor and beer mixed daily   Drug use: Yes    Types: Cocaine   Sexual activity: Not Currently  Other Topics Concern   Not on file  Social History Narrative   Not on file   Social Determinants of Health    Financial Resource Strain: Not on file  Food Insecurity: Not on file  Transportation Needs: Not on file  Physical Activity: Not on file  Stress: Not on file  Social Connections: Not on file   Sleep: Good  Appetite:  Good  Current Medications: Current Facility-Administered Medications  Medication Dose Route Frequency Provider Last Rate Last Admin   alum & mag hydroxide-simeth (MAALOX/MYLANTA) 200-200-20 MG/5ML suspension 30 mL  30 mL Oral Q4H PRN Melbourne Abts W, PA-C       haloperidol lactate (HALDOL) injection 5 mg  5 mg Intramuscular Q6H PRN Park Pope, MD       And   LORazepam (ATIVAN) injection 2 mg  2 mg Intramuscular Q6H PRN Park Pope, MD       And   diphenhydrAMINE (BENADRYL) injection 50 mg  50 mg Intramuscular Q6H PRN Park Pope, MD       feeding supplement (BOOST / RESOURCE BREEZE) liquid 1 Container  237 mL Oral BID BM Park Pope, MD   1 Container at 12/01/21 [id number]   gabapentin (NEURONTIN) capsule 300 mg  300 mg Oral TID Phineas Inches, MD   300 mg at 12/01/21 7628   ibuprofen (ADVIL) tablet 800 mg  800 mg Oral Q8H PRN Park Pope, MD   800 mg at 11/30/21 3151   influenza vac split quadrivalent PF (FLUARIX) injection 0.5 mL  0.5 mL Intramuscular Tomorrow-1000 Mason Jim, Amy E, MD       lactulose (CHRONULAC) 10 GM/15ML solution 10 g  10 g Oral BID Park Pope, MD   10 g at 12/01/21 7616   magnesium hydroxide (MILK OF MAGNESIA) suspension 30 mL  30 mL Oral Daily PRN Jaclyn Shaggy, PA-C       multivitamin with minerals tablet 1 tablet  1 tablet Oral Daily Melbourne Abts W, PA-C   1 tablet at 12/01/21 0737   OLANZapine (ZYPREXA) tablet 2.5 mg  2.5 mg Oral BID Massengill, Harrold Donath, MD   2.5 mg at 12/01/21 1062   And   OLANZapine (ZYPREXA) tablet 10 mg  10 mg Oral QHS Massengill, Harrold Donath, MD   10 mg at 11/30/21 2030   OLANZapine zydis (ZYPREXA) disintegrating tablet 5 mg  5 mg Oral Q8H PRN Massengill, Harrold Donath, MD   5 mg at 11/24/21 1358   ondansetron (ZOFRAN-ODT) disintegrating  tablet 8 mg  8 mg Oral Q8H PRN Lamar Sprinkles, MD       Oxcarbazepine (TRILEPTAL) tablet 300 mg  300 mg Oral BID Massengill, Harrold Donath, MD   300 mg at 12/01/21 6948   thiamine tablet 100 mg  100 mg Oral Daily Melbourne Abts W, PA-C   100 mg at 12/01/21 5462   traZODone (DESYREL) tablet 50 mg  50 mg Oral QHS PRN Phineas Inches, MD   50 mg at 11/29/21 2035    Lab Results:  Results for orders placed or performed during the hospital encounter of 11/18/21 (from the past 48 hour(s))  Hepatitis panel, acute     Status: None   Collection Time: 11/29/21  6:36 PM  Result Value Ref Range   Hepatitis B  Surface Ag NON REACTIVE NON REACTIVE   HCV Ab NON REACTIVE NON REACTIVE    Comment: (NOTE) Nonreactive HCV antibody screen is consistent with no HCV infections,  unless recent infection is suspected or other evidence exists to indicate HCV infection.     Hep A IgM NON REACTIVE NON REACTIVE   Hep B C IgM NON REACTIVE NON REACTIVE    Comment: Performed at [facility], [address], [place], [place] [address]  Hepatic function panel     Status: Abnormal   Collection Time: 11/29/21  6:36 PM  Result Value Ref Range   Total Protein 7.0 6.5 - 8.1 g/dL   Albumin 4.1 3.5 - 5.0 g/dL   AST 76 (H) 15 - 41 U/L   ALT 206 (H) 0 - 44 U/L   Alkaline Phosphatase 82 38 - 126 U/L   Total Bilirubin 0.5 0.3 - 1.2 mg/dL   Bilirubin, Direct 0.1 0.0 - 0.2 mg/dL   Indirect Bilirubin 0.4 0.3 - 0.9 mg/dL    Comment: Performed at [facility], [address] [place], [place] [address]  Ammonia     Status: Abnormal   Collection Time: 11/29/21  6:36 PM  Result Value Ref Range   Ammonia 65 (H) 9 - 35 umol/L    Comment: Performed at [facility], [address], [place], [place] [address]     Blood Alcohol level:  Lab Results  Component Value Date   ETH <10 11/17/2021    Metabolic Disorder Labs: Lab Results  Component Value Date   HGBA1C 4.8 11/19/2021   MPG 91.06  11/19/2021   No results found for: PROLACTIN Lab Results  Component Value Date   CHOL 138 11/19/2021   TRIG 249 (H) 11/19/2021   HDL 43 11/19/2021   CHOLHDL 3.2 11/19/2021   VLDL 50 (H) 11/19/2021   LDLCALC 45 11/19/2021    Physical Findings: CIWA:  CIWA-Ar Total: 0  Musculoskeletal: Strength & Muscle Tone: within normal limits Gait & Station: untested in bed Patient leans: N/A  Psychiatric Specialty Exam:  Presentation  General Appearance: appropriate, casually dressed, more alert this morning  Eye Contact: Fair  Speech:Clear and Coherent; Normal Rate  Speech Volume:Normal  Handedness:Right   Mood and Affect  Mood: "fine" - appears calm and more euthymic  Affect:constricted   Thought Process  Thought Processes:Linear; Coherent  Orientation:Full (Time, Place and Person)  Thought Content: Denies SI/HI/VH. Does not appear to be responding to internal stimuli. Denies intrusive thoughts  Hallucinations: denies  Ideas of Reference: denies  Suicidal Thoughts:denies  Homicidal Thoughts:denies    Sensorium  Judgment:improving  Insight:improving    Executive Functions  Concentration:Fair  Attention Span:Fair  Recall:Fair  Fund of Knowledge:Fair  Language:Good   Psychomotor Activity  Psychomotor Activity:decreased but more than yesterday; AIMS 0, no cogwheeling, no stiffness, no tremor    Assets  Assets:Resilience; Communication Skills   Sleep  Hours not recorded   Physical Exam Vitals reviewed.  Constitutional:      General: He is not in acute distress.    Appearance: He is normal weight.  Pulmonary:     Effort: Pulmonary effort is normal.  Neurological:     Mental Status: He is alert.     Motor: No weakness.     Gait: Gait normal.   Review of Systems  Constitutional:  Negative for chills and fever.  Cardiovascular:  Negative for chest pain and palpitations.  Gastrointestinal:  Negative for abdominal pain, constipation,  diarrhea, nausea and vomiting.  All other systems reviewed and are negative.  Blood pressure 120/83, pulse (!) 120, temperature (!) 97.4 F (36.3 C), temperature source Oral, resp. rate 16, height 5' 7.5" (1.715 m), weight 75.8 kg, SpO2 98 %. Body mass index is 25.77 kg/m.   Treatment Plan Summary: Daily contact with patient to assess and evaluate symptoms and progress in treatment and Medication management   PLAN Safety and Monitoring: Involuntary admission to inpatient psychiatric unit for safety, stabilization and treatment Daily contact with patient to assess and evaluate symptoms and progress in treatment Patient's case to be discussed in multi-disciplinary team meeting Observation Level : q15 minute checks Vital signs: q12 hours Precautions: suicide, elopement, and assault     Psychiatric Problems Schizoaffective d/o bipolar type Alcohol Use Disorder-Severe -Continue Zyprexa 2.5 mg bid (8:00 and 14:00) and 10 mg at night for psychotic symptoms and mood lability.  - Continue trileptal 300 mg bid - for mood stabilization.  -Continue Gabapentin 300 mg tid for anxiety and alcohol craving -Agitation protocol: haldol, benadryl, ativan -Continue trazodone 50 mg as needed - for insomnia    Medical Problems  Elevated AST/ALT and hyperammonemia -AST 111 > 76, ALT 258 > 206 -PT-INR wnl, amylase 96, lipase 32 -Hepatitis panel nonreactive -Ammonia level elevated 65, rechecking ammonia tomorrow morning prior to discharge -Continue Lactulose 10 mg BID for possible hepatic encephalopathy  -RUQ u/s showed no focal lesions and WNL parenchymal echogenicity in the liver - normal RUQ u/s -Per Dr. Bosie Clos, should patient have further elevation in AST/ALT, patient will require RUQ ultrasound and ED re-evaluation. Otherwise, if patient continues to be asymptomatic and labs are downtrending, outpatient PCP follow up is appropriate.   STD Screening -HIV nonreactive -RPR  nonreactive -GC/Chlamydia pending -Hepatitis panel nonreactive  Left Shoulder Pain -Left shoulder CXR: There is no evidence of fracture or dislocation. There is no evidence of arthropathy or other focal bone abnormality. Soft tissues are unremarkable. -Tylenol 1000 mg bid for pain -Ibuprofen 800 tid prn for breakthrough pain   PRNs -Maalox/Mylanta 30 mL for indigestion -Hydroxyzine 25 mg tid for anxiety -Milk of Magnesia 30 mL for constipation -Zofran for nausea/vomiting   4. Discharge Planning: Social work and case management to assist with discharge planning and identification of hospital follow-up needs prior to discharge Estimated LOS: 7-14 days Discharge Concerns: Need to establish a safety plan; Medication compliance and effectiveness Discharge Goals: Return home with outpatient referrals for mental health follow-up including medication management/psychotherapy  Park Pope, MD PGY1 12/01/2021, 10:38 AM I have evaluated and discussed the case with House officer  during a face-to-face assesment.  I reviewed the patient's chart participated the treatement plan, and I agree with the assessment and plan of care as documented Serum ammonia level is ordered and is pending. Will continue to monitor and review labs. Thresa Ross, MD

## 2021-12-01 NOTE — Progress Notes (Signed)
°   12/01/21 2300  Psych Admission Type (Psych Patients Only)  Admission Status Involuntary  Psychosocial Assessment  Patient Complaints Anxiety  Eye Contact Brief  Facial Expression Anxious;Pensive  Affect Anxious;Preoccupied  Speech Logical/coherent  Interaction Assertive  Motor Activity Slow  Appearance/Hygiene Unremarkable  Behavior Characteristics Anxious  Mood Anxious  Thought Process  Coherency Circumstantial  Content Blaming self  Delusions None reported or observed  Perception WDL  Hallucination None reported or observed  Judgment Poor  Confusion None  Danger to Self  Current suicidal ideation? Denies  Danger to Others  Danger to Others None reported or observed

## 2021-12-01 NOTE — Progress Notes (Signed)
Adult Psychoeducational Group Note  Date:  12/01/2021 Time:  9:07 PM  Group Topic/Focus:  Wrap-Up Group:   The focus of this group is to help patients review their daily goal of treatment and discuss progress on daily workbooks.  Participation Level:  Active  Participation Quality:  Appropriate  Affect:  Appropriate  Cognitive:  Appropriate  Insight: Appropriate  Engagement in Group:  Engaged  Modes of Intervention:  Discussion  Additional Comments:   Pt stated his goal for today was to focus on his treatment plan. Pt stated he accomplished his goal today. Pt stated he did not get a chance to talked with his doctor or his social worker about his care today. Pt rated his overall day a 9 out of 10. Pt stated he enjoyed interacting with his peers at dinner tonight.Pt stated he was able to contact his sister and his mother today which improved his overall day. Pt stated he felt better about himself today. Pt stated he attend all meals today. Pt stated he took all medications provided today. Pt stated he attend all groups held today. Pt stated his appetite was pretty good today. Pt rated sleep last night was good. Pt stated the goal tonight was to get some rest. Pt stated he had some physical pain tonight.Pt stated he had some mild pain in his left shoulder. Pt rated the mild pain in his left shoulder a 3 on the pain level scale. Pt nurse was updated on the situation. Pt deny visual hallucinations and auditory issues tonight. Pt denies thoughts of harming himself or others. Pt stated he would alert staff if anything changed.  Felipa Furnace 12/01/2021, 9:07 PM

## 2021-12-01 NOTE — Progress Notes (Signed)
Pt continues to be anxious.  Pt complied with scheduled medications. Pt currently denies SI/ HI/AVH and pain to this writer during the shift. Pt interacts appropriately with others in the milieu. Pt verbally contracted for safety.   A: Support and encouragement was offered and accepted. Pt medications were administered per order. Safety rounds continued and maintained Q 15 throughout the shift.    R: Safety maintained. Will continue to monitor and assess.      12/01/21 0100  Psych Admission Type (Psych Patients Only)  Admission Status Involuntary  Psychosocial Assessment  Eye Contact Brief  Facial Expression Anxious;Pensive  Affect Anxious;Preoccupied  Speech Logical/coherent  Interaction Assertive  Motor Activity Slow  Appearance/Hygiene Unremarkable  Behavior Characteristics Appropriate to situation  Thought Process  Coherency Circumstantial  Content Blaming self  Delusions None reported or observed  Perception WDL  Hallucination None reported or observed  Judgment Poor  Confusion None  Danger to Self  Current suicidal ideation? Denies  Danger to Others  Danger to Others None reported or observed

## 2021-12-02 LAB — HEPATIC FUNCTION PANEL
ALT: 123 U/L — ABNORMAL HIGH (ref 0–44)
AST: 43 U/L — ABNORMAL HIGH (ref 15–41)
Albumin: 4.2 g/dL (ref 3.5–5.0)
Alkaline Phosphatase: 84 U/L (ref 38–126)
Bilirubin, Direct: 0.1 mg/dL (ref 0.0–0.2)
Indirect Bilirubin: 0.4 mg/dL (ref 0.3–0.9)
Total Bilirubin: 0.5 mg/dL (ref 0.3–1.2)
Total Protein: 7.4 g/dL (ref 6.5–8.1)

## 2021-12-02 LAB — AMMONIA: Ammonia: 50 umol/L — ABNORMAL HIGH (ref 9–35)

## 2021-12-02 MED ORDER — OXCARBAZEPINE 300 MG PO TABS: 300.0000 mg | ORAL_TABLET | Freq: Two times a day (BID) | ORAL | 0 refills | Status: AC

## 2021-12-02 MED ORDER — TRAZODONE HCL 50 MG PO TABS
50.0000 mg | ORAL_TABLET | Freq: Every evening | ORAL | 0 refills | Status: AC | PRN
Start: 2021-12-02 — End: ?

## 2021-12-02 MED ORDER — GABAPENTIN 300 MG PO CAPS: 300.0000 mg | ORAL_CAPSULE | Freq: Three times a day (TID) | ORAL | 0 refills | Status: AC

## 2021-12-02 MED ORDER — OLANZAPINE 10 MG PO TABS: 10.0000 mg | ORAL_TABLET | Freq: Every day | ORAL | 0 refills | Status: AC

## 2021-12-02 MED ORDER — OLANZAPINE 2.5 MG PO TABS: 2.5000 mg | ORAL_TABLET | Freq: Two times a day (BID) | ORAL | 0 refills | Status: AC

## 2021-12-02 NOTE — BHH Suicide Risk Assessment (Signed)
[facility] Discharge Suicide Risk Assessment   Principal Problem: Schizoaffective disorder, bipolar type (HCC) Discharge Diagnoses: Principal Problem:   Schizoaffective disorder, bipolar type (HCC) Active Problems:   Transaminitis   Hyperammonemia (HCC)   Total Time spent with patient: 30 minutes  Musculoskeletal: Strength & Muscle Tone: within normal limits Gait & Station: normal Patient leans:  no lean  Psychiatric Specialty Exam  Presentation  General Appearance: Appropriate for Environment; Casual; Fairly Groomed  Eye Contact:Good  Speech:Clear and Coherent; Normal Rate  Speech Volume:Normal  Handedness:Right   Mood and Affect  Mood:euthmic  Duration of Depression Symptoms: No data recorded Affect:Restricted; Congruent   Thought Process  Thought Processes:Linear; Coherent  Descriptions of Associations:linear  Orientation:Full (Time, Place and Person)  Thought Content:LInear , denies hallucinations or praranoia  History of Schizophrenia/Schizoaffective disorder:Yes  Duration of Psychotic Symptoms:Greater than six months  Hallucinations:dnies Ideas of Reference:None  Suicidal Thoughts:denies Homicidal Thoughts denies  Sensorium  Memory:Immediate Good; Recent Good; Remote Good  Judgment:Fair  Insight:Fair   Executive Functions  Concentration:Fair  Attention Span:Fair  Recall:Fair  Fund of Knowledge:fair  Language:Good   Psychomotor Activity  Psychomotor Activity:normal  Assets  Assets:Resilience; Communication Skills   Sleep  Sleep:adequate  Physical Exam: Physical Exam Review of Systems  Cardiovascular:  Negative for chest pain.  Genitourinary:  Negative for flank pain.  Neurological:  Negative for tremors.  Psychiatric/Behavioral:  Negative for depression and suicidal ideas.   Blood pressure 122/84, pulse 96, temperature 97.8 F (36.6 C), temperature source Oral, resp. rate 16, height 5' 7.5" (1.715 m), weight 75.8 kg, SpO2 98  %. Body mass index is 25.77 kg/m.  Mental Status Per Nursing Assessment::   On Admission:  NA See chart Demographic Factors:  Male, Caucasian, Low socioeconomic status, and Living alone  Loss Factors: Decrease in vocational status and Financial problems/change in socioeconomic status  Historical Factors: Impulsivity and alcohol and drug use  Risk Reduction Factors:   Improved mood and insight,   Continued Clinical Symptoms:  Mood improved, denies hopelessness, paranoia  Cognitive Features That Contribute To Risk:  Closed-mindedness    Suicide Risk:  Minimal: No identifiable suicidal ideation.  Patients presenting with no risk factors but with morbid ruminations; may be classified as minimal risk based on the severity of the depressive symptoms   Follow-up Information     [facility] Follow up.   Specialty: Behavioral Health Why: Please go to this provider for therapy and medication management services during walk in hours:  Monday through Wednesday, from 7:45 am to 11:00 am.  Services are provided on a first come, first served basis. Contact information: [address] [place] [address] [phone]        [facility]. Go to.   Why: You may go to this facility to inquire about and begin therapy and medication management services. Contact information: Address: [address] [place], [place] [address]  Phone: [phone]        [facility]. Go to.   Why: You may go to this facility to inquire about and begin therapy and medication management services. Contact information: Address: [address], [place], [place] [address]  Phone: [phone] Fax: [phone]        [facility] Follow up.   Why: Please call this facility M-F 8am-5pm to schedule a primary care appointment to follow up on your recent labwork. This facility serves those with no insurance. Contact  information: [address] ?[place],  [place] [address]                              Ph: [phone] Fax: [phone]                Plan Of Care/Follow-up recommendations:  Activity:  normal,  FU With PCP, GI . Have reviewd labs and ammonia level, ammonia level improving Compliance with mediations Discussed abstinence of drugs and alcoho Discussed to keep follow up appointments/support group Discussed effect of alcohol and drugs on liver and mood See Discharge Summary or Assessment for details   Thresa Ross, MD 12/02/2021, 10:52 AM

## 2021-12-02 NOTE — Progress Notes (Signed)
°  [facility] Adult Case Management Discharge Plan :  Will you be returning to the same living situation after discharge:  No. [facility]  At discharge, do you have transportation home?: Yes,  Safe Transport  Do you have the ability to pay for your medications: Yes,  Medicare   Release of information consent forms completed and in the chart;  Patient's signature needed at discharge.  Patient to Follow up at:  Follow-up Information     [facility] Follow up.   Specialty: Behavioral Health Why: Please go to this provider for therapy and medication management services during walk in hours:  Monday through Wednesday, from 7:45 am to 11:00 am.  Services are provided on a first come, first served basis. Contact information: [address] [place] [address] [phone]        [facility]. Go to.   Why: You may go to this facility to inquire about and begin therapy and medication management services. Contact information: Address: [address] [place], [place] [address]  Phone: [phone]        [facility]. Go to.   Why: You may go to this facility to inquire about and begin therapy and medication management services. Contact information: Address: [address] [place], [place] [address]  Phone: [phone]        [facility] Follow up.   Why: Please call this facility M-F 8am-5pm to schedule a primary care appointment to follow up on your recent labwork. This facility serves those with no insurance. Contact information: [address] ?[place], [place] [address]                            Ph: [phone]                Next level of care provider has access to [facility] Link:no  Safety Planning and Suicide Prevention discussed: Yes,  with patient and mother      Has patient been referred to the Quitline?: Patient refused referral  Patient has been referred for addiction  treatment: N/A  Aram Beecham, LCSWA 12/02/2021, 9:19 AM

## 2021-12-02 NOTE — Plan of Care (Signed)
Nurse discussed anxiety and coping skills with patient. 

## 2021-12-02 NOTE — Progress Notes (Signed)
°   12/02/21 0500  Sleep  Number of Hours 7.5

## 2021-12-02 NOTE — Discharge Summary (Signed)
Physician Discharge Summary Note  Patient:  Maurice [name] is an 35 y.o., male MRN:  [id number] DOB:  1986-07-18 Patient phone:  [phone] (home)  Patient address:   [address] [place] [address],  Total Time spent with patient: 45 minutes  Date of Admission:  11/18/2021 Date of Discharge: 12/02/2021  Reason for Admission:  Patient is a 35 year old male with reported psychiatric history of schizophrenia, bipolar disorder, heroin use disorder, cocaine use disorder, alcohol use disorder presents to behavioral health hospital from [facility], ED due to suicidal ideation.   Per H&P Assessment today, patient reports to being admitted to hospital due to "suicidal ideation".  Patient begins to be extremely tangential and responding to internal stimuli throughout assessment.  Patient would turn his head towards an empty space and in a conversation with the air as if speaking with an in visible observer and then redirect to speaking with this Clinical research associate as if nothing had happened.  Patient reports that he had taken a freight train from [place] to [place] because of paranoia with regards to the city of [place].  Patient reports that he had drank multiple liters of alcohol in order to keep himself warm during the freight train ride.  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  Patient then asked this writer whether he would like to perform an alliance with the "street enforcers" in order for him to protect the Asian race here in [place].  Patient appears very defiant towards Caucasian males while on the unit.  Patient reports he has a history of schizophrenia and was reportedly originally from [place].  Patient denies present suicidal ideation, homicidal ideation, auditory/visual hallucinations, but seems to be  actively responding to internal stimuli.  Patient reports that he was formally on Abilify, Zyprexa, and lithium.  Patient reports also having been tried on multiple other psychotropic medications.     Discussed plan to increase patient's Zyprexa for mood stability as well as auditory/visual hallucinations that patient appears to be experiencing.  Also discussed that we would start Depakote for mood stabilization.  Patient reports "I will do what ever Hazle Quant tells me to do".  This Clinical research associate encouraged patient to take the medications as scheduled.  Patient verbalized agreement and had no other questions at this time.  Principal Problem: Schizoaffective disorder, bipolar type [facility]) Discharge Diagnoses: Principal Problem:   Schizoaffective disorder, bipolar type (HCC) Active Problems:   Transaminitis   Hyperammonemia (HCC)   Past Psychiatric History: see H&P  Past Medical History:  Past Medical History:  Diagnosis Date   Bipolar disorder (HCC)    Manic depressive disorder (HCC)    Medical history non-contributory    Schizophrenia (HCC)     Past Surgical History:  Procedure Laterality Date   NO PAST SURGERIES     Family History: History reviewed. No pertinent family history. Family Psychiatric  History: see H&P Social History:  Social History   Substance and Sexual Activity  Alcohol Use Yes   Comment: pt states drinks a gallon of liquor and beer mixed daily     Social History   Substance and Sexual Activity  Drug Use Yes   Types: Cocaine    Social History   Socioeconomic History   Marital status: Single    Spouse name: Not on file  Number of children: Not on file   Years of education: Not on file   Highest education level: Not on file  Occupational History   Not on file  Tobacco Use   Smoking status: Some Days    Types: Cigarettes   Smokeless tobacco: Never  Vaping Use   Vaping Use: Never used  Substance and Sexual Activity   Alcohol use: Yes    Comment: pt states  drinks a gallon of liquor and beer mixed daily   Drug use: Yes    Types: Cocaine   Sexual activity: Not Currently  Other Topics Concern   Not on file  Social History Narrative   Not on file   Social Determinants of Health   Financial Resource Strain: Not on file  Food Insecurity: Not on file  Transportation Needs: Not on file  Physical Activity: Not on file  Stress: Not on file  Social Connections: Not on file    Hospital Course:   HOSPITAL COURSE   Patient was admitted via emergency department because of erratic behaviors.    After admission, patient was started on Zyprexa, Depakote, and gabapentin. Patient was on 1:1 due to extremely erratic and hypersexual behavior including masturbating in male patient's rooms and homicidal ideation towards staff (specifically caucasian). Patient was switched from depakote to trileptal due to elevated liver enzymes. Patient was also started on lactulose near end of admission due to continued daytime sedation and concern for hepatic encephalopathy.  Gradually, patient started adjusting to milieu.   Case was discussed in multi disciplinary team meeting.   During later part of hospitalization, mood and affect improved.   Patient denied any hallucinations or delusions and prior to discharge patient repeatedly denied any thoughts intentions or plans of harming her hurting himself or anyone else, that was carefully explored with the patient.     Case was also discussed in multi disciplinary team meeting    Importance of adherence with the treatment and relapse prevention were discussed with the patient.     Total abstinent from substances of abuse was advised and patient was advised to attend AA meetings.   For detailed history, mental status examination at time of admission, diagnoses and treatment plan, please see the dictated psychiatric evaluation at the time of admission After admission, patient was seen in individual supportive  psychotherapy, was encouraged to participate in unit milieu.  Case was also discussed with the staff.   During the later part of hospitalization mood and affect started improving.  Patient was feeling more hopeful.  No side effects from medication reported.  Physical Findings: AIMS: Facial and Oral Movements Muscles of Facial Expression: None, normal Lips and Perioral Area: None, normal Jaw: None, normal Tongue: None, normal,Extremity Movements Upper (arms, wrists, hands, fingers): None, normal Lower (legs, knees, ankles, toes): None, normal, Trunk Movements Neck, shoulders, hips: None, normal, Overall Severity Severity of abnormal movements (highest score from questions above): None, normal Incapacitation due to abnormal movements: None, normal Patient's awareness of abnormal movements (rate only patient's report): No Awareness, Dental Status Current problems with teeth and/or dentures?: No Does patient usually wear dentures?: No  CIWA:  CIWA-Ar Total: 0  Musculoskeletal: Strength & Muscle Tone: within normal limits Gait & Station: normal Patient leans: N/A   Psychiatric Specialty Exam:  Presentation  General Appearance: Appropriate for Environment; Casual; Fairly Groomed  Eye Contact:Good  Speech:Clear and Coherent; Normal Rate  Speech Volume:Normal  Handedness:Right   Mood and Affect  Mood: Euthymic Affect: Full range  Thought Process  Thought Processes:Linear; Coherent  Descriptions of Associations:Tangential  Orientation:Full (Time, Place and Person)  Thought Content: Denies SI/HI/AVH. Does not appear to be responding to internal stimuli. History of Schizophrenia/Schizoaffective disorder:Yes  Duration of Psychotic Symptoms:Greater than six months  Hallucinations: denies Ideas of Reference:None  Suicidal Thoughts: denies Homicidal Thoughts: denies  Sensorium  Memory:Immediate Good; Recent Good; Remote  Good  Judgment:Fair  Insight:Fair   Executive Functions  Concentration:Fair  Attention Span:Fair  Recall:Fair  Fund of Knowledge:Poor  Language:Good   Psychomotor Activity  Psychomotor Activity:No data recorded  Assets  Assets:Resilience; Communication Skills   Sleep  Sleep:No data recorded   Physical Exam: Physical Exam Vitals and nursing note reviewed.  Constitutional:      Appearance: Normal appearance. He is normal weight.  HENT:     Head: Normocephalic and atraumatic.  Pulmonary:     Effort: Pulmonary effort is normal.  Neurological:     General: No focal deficit present.     Mental Status: He is oriented to person, place, and time.   Review of Systems  Respiratory:  Negative for shortness of breath.   Cardiovascular:  Negative for chest pain.  Gastrointestinal:  Negative for abdominal pain, constipation, diarrhea, heartburn, nausea and vomiting.  Neurological:  Negative for headaches.  Blood pressure 122/84, pulse 96, temperature 97.8 F (36.6 C), temperature source Oral, resp. rate 16, height 5' 7.5" (1.715 m), weight 75.8 kg, SpO2 98 %. Body mass index is 25.77 kg/m.   Social History   Tobacco Use  Smoking Status Some Days   Types: Cigarettes  Smokeless Tobacco Never   Tobacco Cessation:  A prescription for an FDA-approved tobacco cessation medication provided at discharge   Blood Alcohol level:  Lab Results  Component Value Date   ETH <10 11/17/2021    Metabolic Disorder Labs:  Lab Results  Component Value Date   HGBA1C 4.8 11/19/2021   MPG 91.06 11/19/2021   No results found for: PROLACTIN Lab Results  Component Value Date   CHOL 138 11/19/2021   TRIG 249 (H) 11/19/2021   HDL 43 11/19/2021   CHOLHDL 3.2 11/19/2021   VLDL 50 (H) 11/19/2021   LDLCALC 45 11/19/2021    See Psychiatric Specialty Exam and Suicide Risk Assessment completed by Attending Physician prior to discharge.  Discharge destination:  Home  Is patient  on multiple antipsychotic therapies at discharge:  No   Has Patient had three or more failed trials of antipsychotic monotherapy by history:  No  Recommended Plan for Multiple Antipsychotic Therapies: NA  Discharge Instructions     [facility] pharmacy consult for medication samples   Complete by: As directed    7 days please of medications, as patient will be going to [place] immediately upon discharge.  Please fill: olanzapine, trileptal, gabapentin Thank you      Allergies as of 12/02/2021   No Known Allergies      Medication List     TAKE these medications      Indication  gabapentin 300 MG capsule Commonly known as: NEURONTIN Take 1 capsule (300 mg total) by mouth 3 (three) times daily.  Indication: Social Anxiety Disorder   ibuprofen 200 MG tablet Commonly known as: ADVIL Take 400 mg by mouth every 6 (six) hours as needed for headache or moderate pain.  Indication: Pain   OLANZapine 2.5 MG tablet Commonly known as: ZYPREXA Take 1 tablet (2.5 mg total) by mouth 2 (two) times daily.  Indication: Schizophrenia   OLANZapine 10 MG tablet  Commonly known as: ZYPREXA Take 1 tablet (10 mg total) by mouth at bedtime.  Indication: Schizophrenia   Oxcarbazepine 300 MG tablet Commonly known as: TRILEPTAL Take 1 tablet (300 mg total) by mouth 2 (two) times daily.  Indication: Mood Stabilization   traZODone 50 MG tablet Commonly known as: DESYREL Take 1 tablet (50 mg total) by mouth at bedtime as needed for sleep.  Indication: Trouble Sleeping        Follow-up Information     [facility] Follow up.   Specialty: Behavioral Health Why: Please go to this provider for therapy and medication management services during walk in hours:  Monday through Wednesday, from 7:45 am to 11:00 am.  Services are provided on a first come, first served basis. Contact information: [address] [place] [address] [phone]        [facility]. Go to.   Why: You may go to this facility to inquire about and begin therapy and medication management services. Contact information: Address: [address] [place], [place] [address]  Phone: [phone]        [facility]. Go to.   Why: You may go to this facility to inquire about and begin therapy and medication management services. Contact information: Address: [address], [place], [place] [address]  Phone: [phone] Fax: [phone]        [facility] Follow up.   Why: Please call this facility M-F 8am-5pm to schedule a primary care appointment to follow up on your recent labwork. This facility serves those with no insurance. Contact information: [address] ?[place], [place] [address]                              Ph: [phone] Fax: [phone]                 Follow-up recommendations:   Activity:  as tolerated Diet:  heart healthy   Comments:  Prescriptions were given at discharge.  Patient is agreeable with the discharge plan.  Patient was given an opportunity to ask questions.  Patient appears to feel comfortable with discharge and denies any current suicidal or homicidal thoughts.    Patient is instructed prior to discharge to: Take all medications as prescribed by mental healthcare provider. Report any adverse effects and or reactions from the medicines to outpatient provider promptly. In the event of worsening symptoms, patient is instructed to call the crisis hotline, 911 and or go to the nearest ED for appropriate evaluation and treatment of symptoms. Patient is to follow-up with primary care provider for other medical issues, concerns and or health care needs.   Signed: Park Pope, MD 12/02/2021, 1:06 PM

## 2021-12-02 NOTE — Progress Notes (Signed)
Discharge Note:  Patient discharged to [facility] via [employer].  Suicide prevention information given and discussed with patient who stated he understood and had no questions.  Denied SI and HI.  Denied A/V hallucinations.  Patient stated he received all his belongings.  Patient stated he appreciated all assistance received at [facility].

## 2021-12-02 NOTE — Group Note (Signed)
Recreation Therapy Group Note   Group Topic:Team Building  Group Date: 12/02/2021 Start Time: 0950 End Time: 1028 Facilitators: Caroll Rancher, LRT,CTRS Location: [address] Dayroom   Goal Area(s) Addresses:  Patient will effectively work with peer towards shared goal.  Patient will identify skills used to make activity successful.  Patient will identify how skills used during activity can be used to reach post d/c goals.   Group Description: Landing Pad. In teams of 3-5, patients were given 12 plastic drinking straws and an equal length of masking tape. Using the materials provided, patients were asked to build a landing pad to catch a golf ball dropped from approximately 5 feet in the air. All materials were required to be used by the team in their design. LRT facilitated post-activity discussion.   Affect/Mood: Appropriate   Participation Level: Engaged   Participation Quality: Independent   Behavior: Appropriate   Speech/Thought Process: Focused   Insight: Good   Judgement: Good   Modes of Intervention: STEM Activity   Patient Response to Interventions:  Engaged   Education Outcome:  Acknowledges education and In group clarification offered    Clinical Observations/Individualized Feedback: Pt was bright and engaged.  Pt was slow to get into doing the activity.  Once pt got going, pt was giving suggestions and listening to the suggestions of peers to complete task.  Pt and peers were able to be successful in creating a landing pad that worked.  During processing, pt expressed using communication was one skill the group used to complete the task.      Plan: Continue to engage patient in RT group sessions 2-3x/week.   Caroll Rancher, Antonietta Jewel 12/02/2021 12:51 PM

## 2023-06-16 IMAGING — US US ABDOMEN LIMITED
1 series · 15 of 25 positions shown · non-contrast
Comparison: None.

CLINICAL DATA: Increased LFTs

EXAM:
ULTRASOUND ABDOMEN LIMITED RIGHT UPPER QUADRANT

[Series 1: us abdomen limited ruq mc & wl · 15 of 65 slices shown]
[im 1/65]
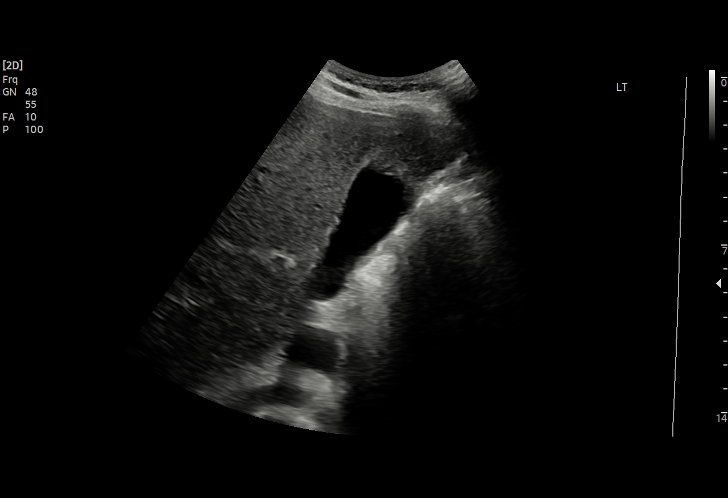
[im 6/65]
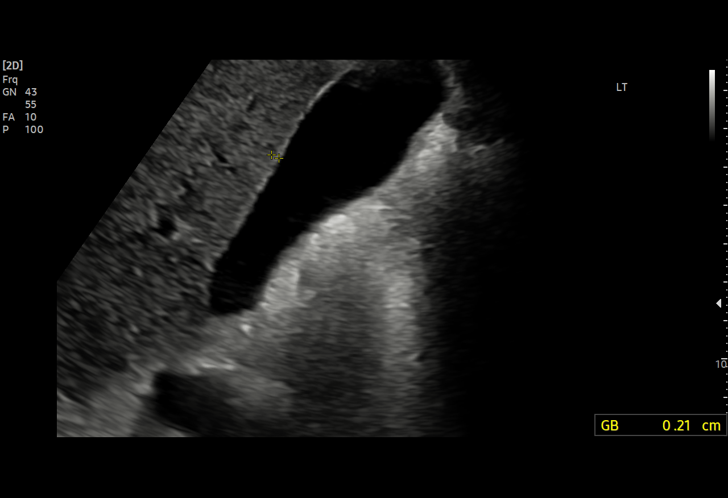
[im 11/65]
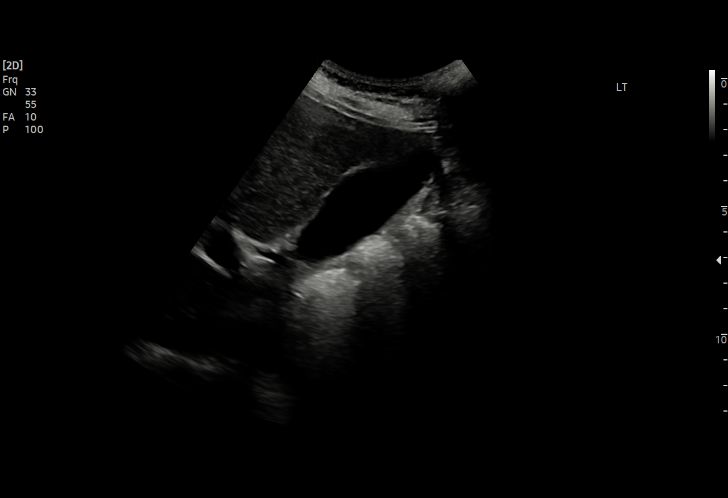
[im 14/65]
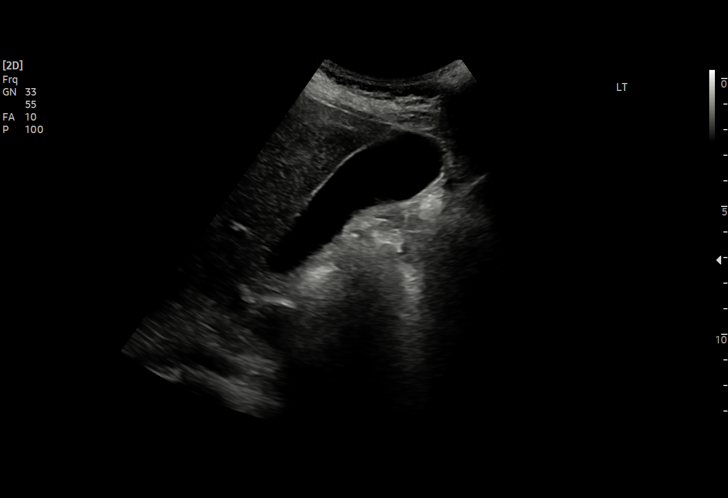
[im 19/65]
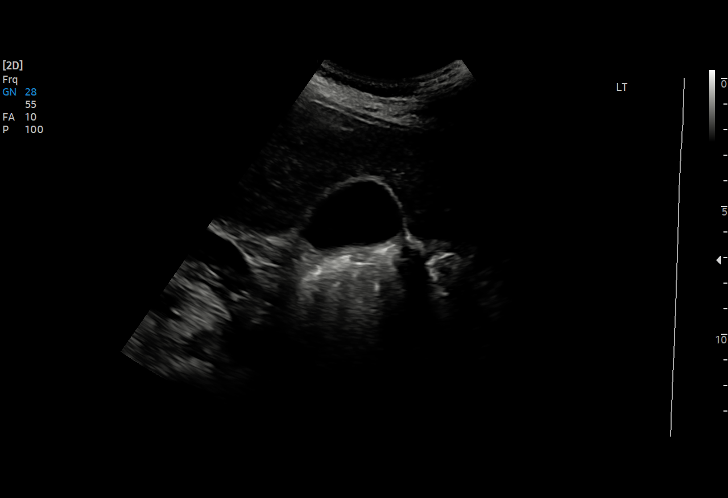
[im 25/65]
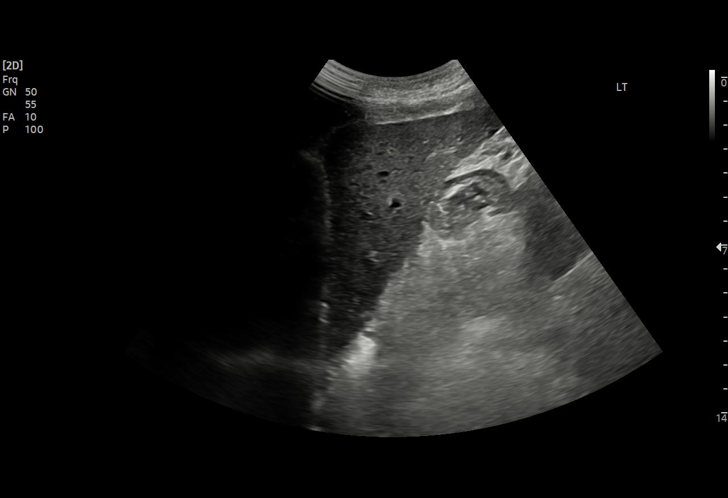
[im 27/65]
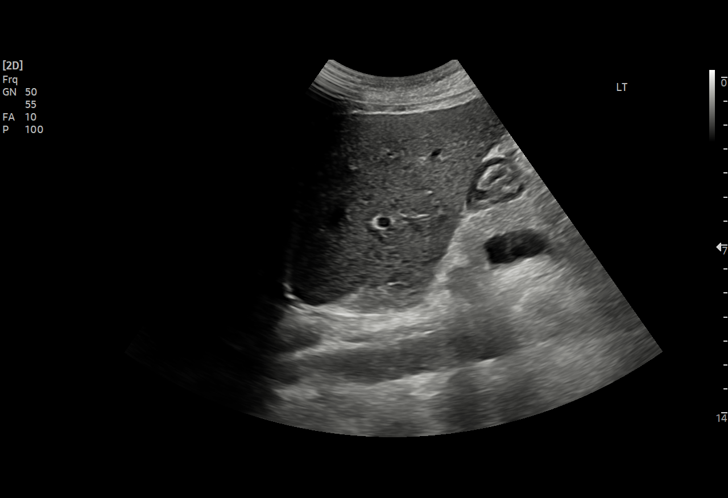
[im 33/65]
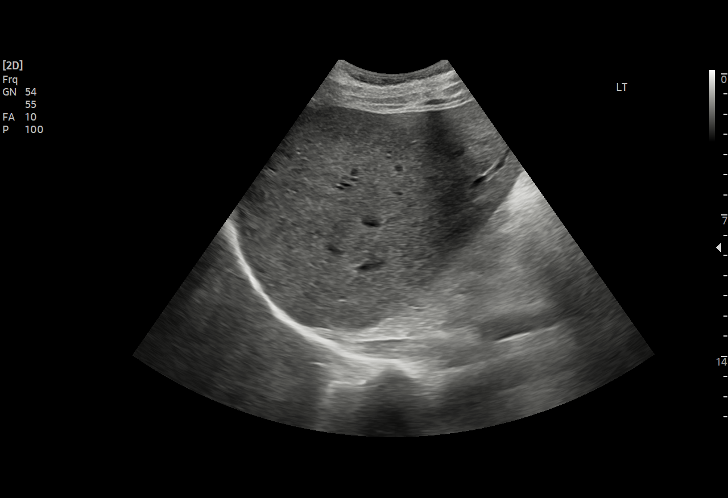
[im 38/65]
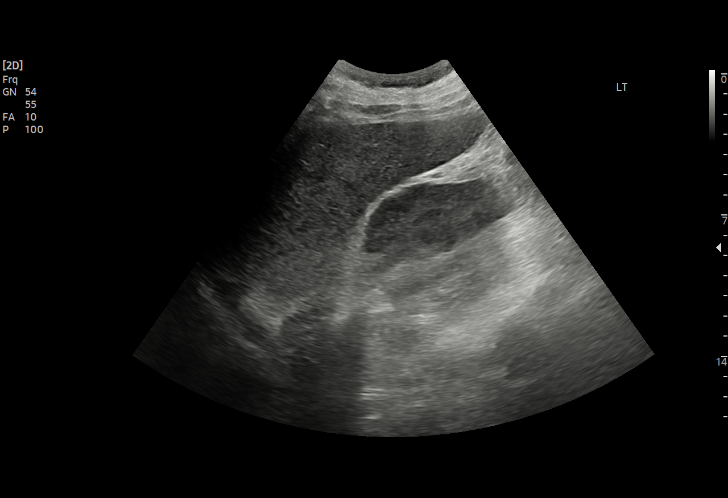
[im 41/65]
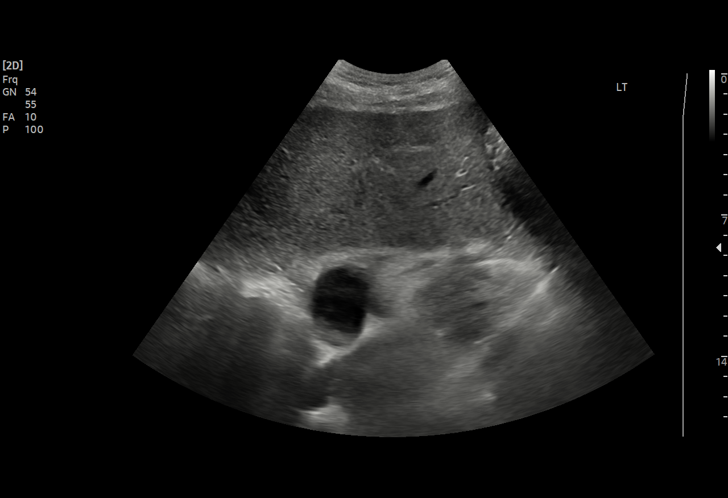
[im 46/65]
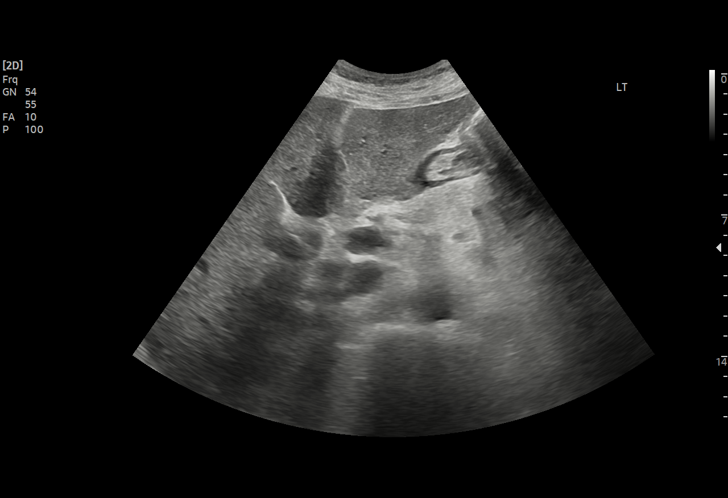
[im 51/65]
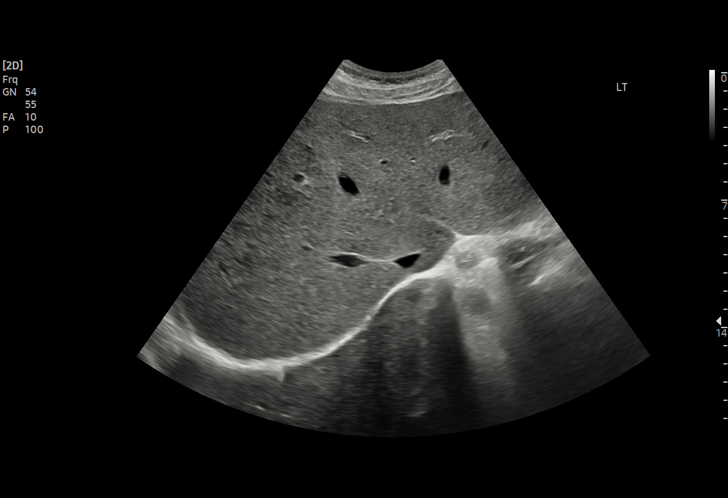
[im 54/65]
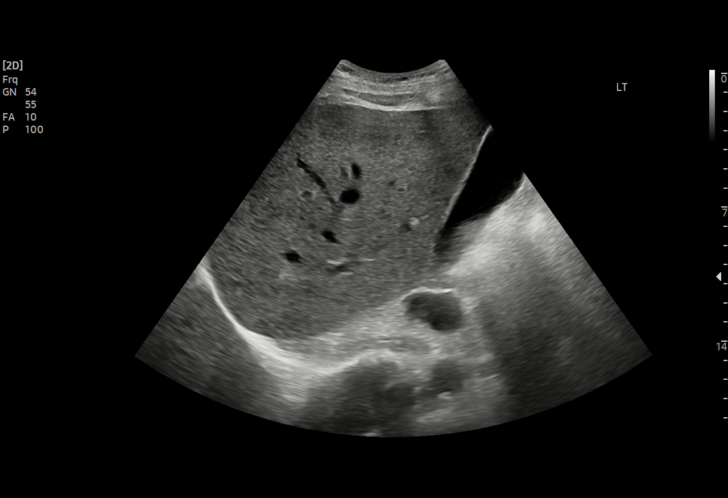
[im 59/65]
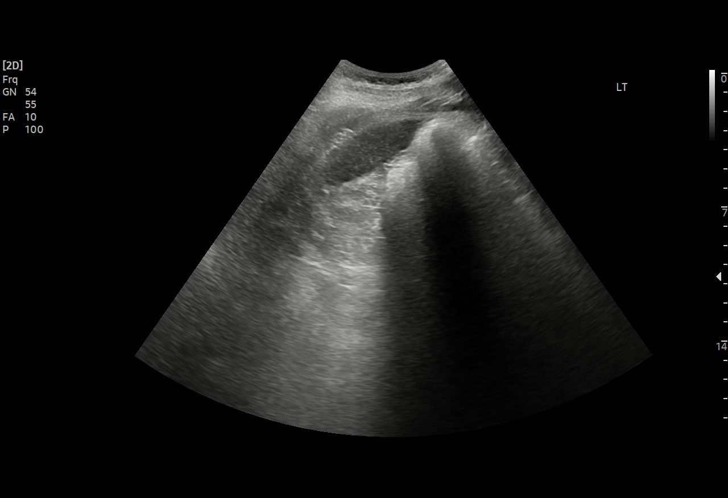
[im 65/65]
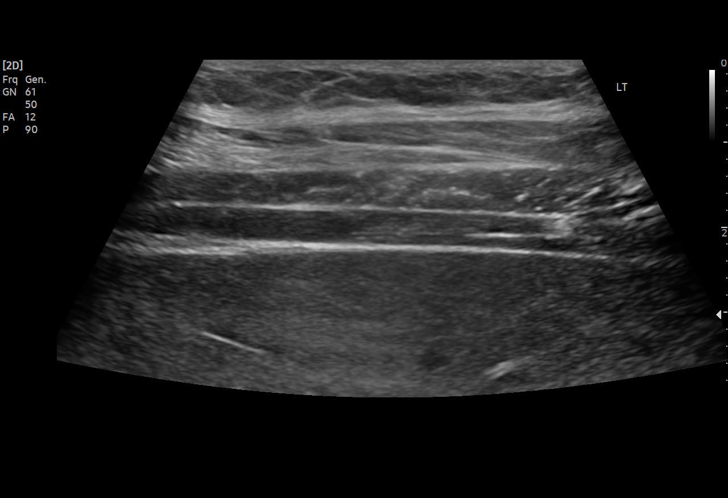

[15 of 25 positions shown; findings below may reference images not displayed]

FINDINGS: Gallbladder:

No gallstones or wall thickening visualized. No sonographic Murphy
sign noted by sonographer.

Common bile duct:

Diameter: 2.7 mm

Liver:

No focal lesion identified. Within normal limits in parenchymal
echogenicity. Portal vein is patent on color Doppler imaging with
normal direction of blood flow towards the liver.

Other: No free fluid or ascites
IMPRESSION: Normal right upper quadrant ultrasound
